# Patient Record
Sex: Female | Born: 1955
Health system: Southern US, Community
[De-identification: ages and names within clinical notes are randomized; demographics above are authoritative.]

## PROBLEM LIST (undated history)

## (undated) DIAGNOSIS — F329 Major depressive disorder, single episode, unspecified: Secondary | ICD-10-CM

## (undated) DIAGNOSIS — G2581 Restless legs syndrome: Secondary | ICD-10-CM

## (undated) DIAGNOSIS — R519 Headache, unspecified: Secondary | ICD-10-CM

## (undated) DIAGNOSIS — F1011 Alcohol abuse, in remission: Secondary | ICD-10-CM

## (undated) DIAGNOSIS — Z9289 Personal history of other medical treatment: Secondary | ICD-10-CM

## (undated) DIAGNOSIS — M1990 Unspecified osteoarthritis, unspecified site: Secondary | ICD-10-CM

## (undated) DIAGNOSIS — Z9189 Other specified personal risk factors, not elsewhere classified: Secondary | ICD-10-CM

## (undated) DIAGNOSIS — K922 Gastrointestinal hemorrhage, unspecified: Secondary | ICD-10-CM

## (undated) DIAGNOSIS — G709 Myoneural disorder, unspecified: Secondary | ICD-10-CM

## (undated) DIAGNOSIS — Z52008 Unspecified donor, other blood: Secondary | ICD-10-CM

## (undated) DIAGNOSIS — R51 Headache: Secondary | ICD-10-CM

## (undated) DIAGNOSIS — F32A Depression, unspecified: Secondary | ICD-10-CM

## (undated) DIAGNOSIS — K219 Gastro-esophageal reflux disease without esophagitis: Secondary | ICD-10-CM

## (undated) HISTORY — PX: OTHER SURGICAL HISTORY: SHX169

## (undated) HISTORY — PX: TONSILLECTOMY: SUR1361

## (undated) HISTORY — PX: BREAST SURGERY: SHX581

---

## 2000-01-15 ENCOUNTER — Other Ambulatory Visit: Admission: RE | Admit: 2000-01-15 | Discharge: 2000-01-15 | Payer: Self-pay | Admitting: *Deleted

## 2001-06-27 ENCOUNTER — Other Ambulatory Visit: Admission: RE | Admit: 2001-06-27 | Discharge: 2001-06-27 | Payer: Self-pay | Admitting: *Deleted

## 2015-11-08 DIAGNOSIS — N6011 Diffuse cystic mastopathy of right breast: Secondary | ICD-10-CM | POA: Insufficient documentation

## 2015-11-14 NOTE — Progress Notes (Signed)
Pt is being scheduled for preop appt; please place surgical orders in epic. Thanks.  

## 2015-11-15 ENCOUNTER — Other Ambulatory Visit: Payer: Self-pay | Admitting: Orthopaedic Surgery

## 2015-11-18 ENCOUNTER — Encounter (HOSPITAL_COMMUNITY)
Admission: RE | Admit: 2015-11-18 | Discharge: 2015-11-18 | Disposition: A | Discharge status: Home / Self Care | Payer: Managed Care, Other (non HMO) | Source: Ambulatory Visit | Attending: Orthopaedic Surgery | Admitting: Orthopaedic Surgery

## 2015-11-18 ENCOUNTER — Encounter (HOSPITAL_COMMUNITY): Payer: Self-pay | Admitting: *Deleted

## 2015-11-18 DIAGNOSIS — Z01812 Encounter for preprocedural laboratory examination: Secondary | ICD-10-CM | POA: Diagnosis present

## 2015-11-18 DIAGNOSIS — M1611 Unilateral primary osteoarthritis, right hip: Secondary | ICD-10-CM | POA: Insufficient documentation

## 2015-11-18 HISTORY — DX: Gastro-esophageal reflux disease without esophagitis: K21.9

## 2015-11-18 HISTORY — DX: Unspecified osteoarthritis, unspecified site: M19.90

## 2015-11-18 HISTORY — DX: Major depressive disorder, single episode, unspecified: F32.9

## 2015-11-18 HISTORY — DX: Restless legs syndrome: G25.81

## 2015-11-18 HISTORY — DX: Depression, unspecified: F32.A

## 2015-11-18 HISTORY — DX: Headache: R51

## 2015-11-18 HISTORY — DX: Headache, unspecified: R51.9

## 2015-11-18 HISTORY — DX: Gastrointestinal hemorrhage, unspecified: K92.2

## 2015-11-18 HISTORY — DX: Myoneural disorder, unspecified: G70.9

## 2015-11-18 HISTORY — DX: Alcohol abuse, in remission: F10.11

## 2015-11-18 HISTORY — DX: Unspecified donor, other blood: Z52.008

## 2015-11-18 HISTORY — DX: Other specified personal risk factors, not elsewhere classified: Z91.89

## 2015-11-18 HISTORY — DX: Personal history of other medical treatment: Z92.89

## 2015-11-18 LAB — BASIC METABOLIC PANEL
ANION GAP: 6 (ref 5–15)
BUN: 18 mg/dL (ref 6–20)
CHLORIDE: 109 mmol/L (ref 101–111)
CO2: 27 mmol/L (ref 22–32)
Calcium: 9.5 mg/dL (ref 8.9–10.3)
Creatinine, Ser: 0.81 mg/dL (ref 0.44–1.00)
Glucose, Bld: 91 mg/dL (ref 65–99)
POTASSIUM: 4.4 mmol/L (ref 3.5–5.1)
SODIUM: 142 mmol/L (ref 135–145)

## 2015-11-18 LAB — SURGICAL PCR SCREEN
MRSA, PCR: NEGATIVE
STAPHYLOCOCCUS AUREUS: POSITIVE — AB

## 2015-11-18 LAB — CBC
HEMATOCRIT: 32.9 % — AB (ref 36.0–46.0)
HEMOGLOBIN: 10.3 g/dL — AB (ref 12.0–15.0)
MCH: 27.3 pg (ref 26.0–34.0)
MCHC: 31.3 g/dL (ref 30.0–36.0)
MCV: 87.3 fL (ref 78.0–100.0)
Platelets: 447 10*3/uL — ABNORMAL HIGH (ref 150–400)
RBC: 3.77 MIL/uL — AB (ref 3.87–5.11)
RDW: 14 % (ref 11.5–15.5)
WBC: 7.9 10*3/uL (ref 4.0–10.5)

## 2015-11-18 LAB — ABO/RH: ABO/RH(D): A POS

## 2015-11-18 NOTE — Pre-Procedure Instructions (Signed)
11-18-15 1330 Pt states is a regular blood donor to [facility]. Donated 2 weeks ago-advised not to donate anymore until after surgery and recovered.

## 2015-11-18 NOTE — Patient Instructions (Addendum)
Daisy MaineJanet N Carson  11/18/2015   Your procedure is scheduled on: 11-25-15  [facility]  [facility] 3rd floor to  Short Stay Center at 0900  AM.  Call this number if you have problems the morning of surgery [phone]   Remember: ONLY 1 PERSON MAY GO WITH YOU TO SHORT STAY TO GET  READY MORNING OF YOUR SURGERY.  Do not eat food or drink liquids :After Midnight.     Take these medicines the morning of surgery with A SIP OF WATER: Pantoprazole. Pain med-as needed. Duloxetine. Gabapentin. Ranitidine. Atorvastatin. DO NOT TAKE ANY DIABETIC MEDICATIONS DAY OF YOUR SURGERY                               You may not have any metal on your body including hair pins and              piercings  Do not wear jewelry, make-up, lotions, powders or perfumes, deodorant             Do not wear nail polish.  Do not shave  48 hours prior to surgery.              Men may shave face and neck.   Do not bring valuables to the hospital. [facility] IS NOT             RESPONSIBLE   FOR VALUABLES.  Contacts, dentures or bridgework may not be worn into surgery.  Leave suitcase in the car. After surgery it may be brought to your room.     Patients discharged the day of surgery will not be allowed to drive home.  Name and phone number of your driver:[name]"Boyd"- spouse  [phone] cell  Special Instructions: N/A              Please read over the following fact sheets you were given: _____________________________________________________________________             [facility] - Preparing for [facility] Before surgery, you can play an important role.  Because skin is not sterile, your skin needs to be as free of germs as possible.  You can reduce the number of germs on your skin by washing with CHG (chlorahexidine gluconate) soap before surgery.  CHG is an antiseptic cleaner which kills germs and bonds with the skin to continue killing germs even after  washing. Please DO NOT use if you have an allergy to CHG or antibacterial soaps.  If your skin becomes reddened/irritated stop using the CHG and inform your nurse when you arrive at Short Stay. Do not shave (including legs and underarms) for at least 48 hours prior to the first CHG shower.  You may shave your face/neck. Please follow these instructions carefully:  1.  Shower with CHG Soap the night before surgery and the  morning of Surgery.  2.  If you choose to wash your hair, wash your hair first as usual with your  normal  shampoo.  3.  After you shampoo, rinse your hair and body thoroughly to remove the  shampoo.                           4.  Use CHG as you would any other liquid soap.  You  can apply chg directly  to the skin and wash                       Gently with a scrungie or clean washcloth.  5.  Apply the CHG Soap to your body ONLY FROM THE NECK DOWN.   Do not use on face/ open                           Wound or open sores. Avoid contact with eyes, ears mouth and genitals (private parts).                       Wash face,  Genitals (private parts) with your normal soap.             6.  Wash thoroughly, paying special attention to the area where your surgery  will be performed.  7.  Thoroughly rinse your body with warm water from the neck down.  8.  DO NOT shower/wash with your normal soap after using and rinsing off  the CHG Soap.                9.  Pat yourself dry with a clean towel.            10.  Wear clean pajamas.            11.  Place clean sheets on your bed the night of your first shower and do not  sleep with pets. Day of Surgery : Do not apply any lotions/deodorants the morning of surgery.  Please wear clean clothes to the hospital/surgery center.  FAILURE TO FOLLOW THESE INSTRUCTIONS MAY RESULT IN THE CANCELLATION OF YOUR SURGERY PATIENT SIGNATURE_________________________________  NURSE  SIGNATURE__________________________________  ________________________________________________________________________

## 2015-11-21 NOTE — Pre-Procedure Instructions (Signed)
11-21-15 0905 Positive for Staph aureus, pt made aware -will be treated with Betadine Ointment on arrival to Short Stay. Note to Dr. Alben Spittle. Blackman's office to inform of this.

## 2015-11-25 ENCOUNTER — Inpatient Hospital Stay (HOSPITAL_COMMUNITY): Payer: Managed Care, Other (non HMO) | Admitting: Certified Registered Nurse Anesthetist

## 2015-11-25 ENCOUNTER — Inpatient Hospital Stay (HOSPITAL_COMMUNITY): Payer: Managed Care, Other (non HMO)

## 2015-11-25 ENCOUNTER — Encounter (HOSPITAL_COMMUNITY)
Admission: RE | Disposition: A | Discharge status: Home Health | Payer: Self-pay | Source: Ambulatory Visit | Attending: Orthopaedic Surgery

## 2015-11-25 ENCOUNTER — Encounter (HOSPITAL_COMMUNITY): Payer: Self-pay | Admitting: *Deleted

## 2015-11-25 ENCOUNTER — Inpatient Hospital Stay (HOSPITAL_COMMUNITY)
Admission: RE | Admit: 2015-11-25 | Discharge: 2015-11-28 | DRG: 470 | Disposition: A | Discharge status: Home Health | Payer: Managed Care, Other (non HMO) | Source: Ambulatory Visit | Attending: Orthopaedic Surgery | Admitting: Orthopaedic Surgery

## 2015-11-25 DIAGNOSIS — G2581 Restless legs syndrome: Secondary | ICD-10-CM | POA: Diagnosis present

## 2015-11-25 DIAGNOSIS — K219 Gastro-esophageal reflux disease without esophagitis: Secondary | ICD-10-CM | POA: Diagnosis present

## 2015-11-25 DIAGNOSIS — M25551 Pain in right hip: Secondary | ICD-10-CM

## 2015-11-25 DIAGNOSIS — F329 Major depressive disorder, single episode, unspecified: Secondary | ICD-10-CM | POA: Diagnosis present

## 2015-11-25 DIAGNOSIS — Z96641 Presence of right artificial hip joint: Secondary | ICD-10-CM

## 2015-11-25 DIAGNOSIS — R52 Pain, unspecified: Secondary | ICD-10-CM

## 2015-11-25 DIAGNOSIS — M1611 Unilateral primary osteoarthritis, right hip: Principal | ICD-10-CM | POA: Diagnosis present

## 2015-11-25 DIAGNOSIS — D62 Acute posthemorrhagic anemia: Secondary | ICD-10-CM | POA: Diagnosis not present

## 2015-11-25 DIAGNOSIS — F17210 Nicotine dependence, cigarettes, uncomplicated: Secondary | ICD-10-CM | POA: Diagnosis present

## 2015-11-25 HISTORY — PX: TOTAL HIP ARTHROPLASTY: SHX124

## 2015-11-25 LAB — TYPE AND SCREEN
ABO/RH(D): A POS
ANTIBODY SCREEN: NEGATIVE

## 2015-11-25 SURGERY — ARTHROPLASTY, HIP, TOTAL, ANTERIOR APPROACH
Anesthesia: General | Site: Hip | Laterality: Right

## 2015-11-25 MED ORDER — MENTHOL 3 MG MT LOZG: 1.0000 | LOZENGE | OROMUCOSAL | Status: DC | PRN

## 2015-11-25 MED ORDER — ONDANSETRON HCL 4 MG/2ML IJ SOLN: 4.0000 mg | Freq: Four times a day (QID) | INTRAMUSCULAR | Status: DC | PRN

## 2015-11-25 MED ORDER — DEXAMETHASONE SODIUM PHOSPHATE 10 MG/ML IJ SOLN
INTRAMUSCULAR | Status: DC | PRN
  Administered 2015-11-25: 10 mg via INTRAVENOUS

## 2015-11-25 MED ORDER — ONDANSETRON HCL 4 MG/2ML IJ SOLN
INTRAMUSCULAR | Status: DC | PRN
  Administered 2015-11-25: 4 mg via INTRAVENOUS

## 2015-11-25 MED ORDER — OXYCODONE HCL 5 MG PO TABS
5.0000 mg | ORAL_TABLET | ORAL | Status: DC | PRN
  Administered 2015-11-25 – 2015-11-28 (×15): 10 mg via ORAL
  Filled 2015-11-25 (×15): qty 2

## 2015-11-25 MED ORDER — MIDAZOLAM HCL 5 MG/5ML IJ SOLN
INTRAMUSCULAR | Status: DC | PRN
  Administered 2015-11-25: 2 mg via INTRAVENOUS

## 2015-11-25 MED ORDER — PHENOL 1.4 % MT LIQD
1.0000 | OROMUCOSAL | Status: DC | PRN
  Filled 2015-11-25: qty 177

## 2015-11-25 MED ORDER — PHENYLEPHRINE 40 MCG/ML (10ML) SYRINGE FOR IV PUSH (FOR BLOOD PRESSURE SUPPORT)
PREFILLED_SYRINGE | INTRAVENOUS | Status: DC | PRN
  Administered 2015-11-25 (×3): 40 ug via INTRAVENOUS

## 2015-11-25 MED ORDER — HYDROMORPHONE HCL 1 MG/ML IJ SOLN
1.0000 mg | INTRAMUSCULAR | Status: DC | PRN
  Administered 2015-11-25 – 2015-11-27 (×4): 1 mg via INTRAVENOUS
  Filled 2015-11-25 (×4): qty 1

## 2015-11-25 MED ORDER — FENTANYL CITRATE (PF) 100 MCG/2ML IJ SOLN
INTRAMUSCULAR | Status: DC | PRN
  Administered 2015-11-25: 100 ug via INTRAVENOUS
  Administered 2015-11-25 (×3): 50 ug via INTRAVENOUS

## 2015-11-25 MED ORDER — ACETAMINOPHEN 650 MG RE SUPP: 650.0000 mg | Freq: Four times a day (QID) | RECTAL | Status: DC | PRN

## 2015-11-25 MED ORDER — MIDAZOLAM HCL 2 MG/2ML IJ SOLN
INTRAMUSCULAR | Status: AC
  Filled 2015-11-25: qty 2

## 2015-11-25 MED ORDER — ROCURONIUM BROMIDE 10 MG/ML (PF) SYRINGE
PREFILLED_SYRINGE | INTRAVENOUS | Status: DC | PRN
  Administered 2015-11-25: 40 mg via INTRAVENOUS

## 2015-11-25 MED ORDER — PANTOPRAZOLE SODIUM 40 MG PO TBEC
40.0000 mg | DELAYED_RELEASE_TABLET | Freq: Every day | ORAL | Status: DC
Start: 2015-11-26 — End: 2015-11-28
  Administered 2015-11-26 – 2015-11-28 (×3): 40 mg via ORAL
  Filled 2015-11-25 (×3): qty 1

## 2015-11-25 MED ORDER — FAMOTIDINE 20 MG PO TABS
40.0000 mg | ORAL_TABLET | Freq: Every day | ORAL | Status: DC
Start: 2015-11-26 — End: 2015-11-28
  Administered 2015-11-26 – 2015-11-28 (×3): 40 mg via ORAL
  Filled 2015-11-25 (×3): qty 2

## 2015-11-25 MED ORDER — SUGAMMADEX SODIUM 200 MG/2ML IV SOLN
INTRAVENOUS | Status: DC | PRN
  Administered 2015-11-25: 150 mg via INTRAVENOUS

## 2015-11-25 MED ORDER — ONDANSETRON HCL 4 MG/2ML IJ SOLN
4.0000 mg | Freq: Once | INTRAMUSCULAR | Status: DC | PRN
Start: 2015-11-25 — End: 2015-11-25

## 2015-11-25 MED ORDER — MEPERIDINE HCL 50 MG/ML IJ SOLN: 6.2500 mg | INTRAMUSCULAR | Status: DC | PRN

## 2015-11-25 MED ORDER — ZOLPIDEM TARTRATE 5 MG PO TABS: 5.0000 mg | ORAL_TABLET | Freq: Every evening | ORAL | Status: DC | PRN

## 2015-11-25 MED ORDER — CEFAZOLIN SODIUM-DEXTROSE 2-4 GM/100ML-% IV SOLN
INTRAVENOUS | Status: AC
  Filled 2015-11-25: qty 100

## 2015-11-25 MED ORDER — LIDOCAINE 2% (20 MG/ML) 5 ML SYRINGE
INTRAMUSCULAR | Status: AC
  Filled 2015-11-25: qty 5

## 2015-11-25 MED ORDER — ATORVASTATIN CALCIUM 20 MG PO TABS
20.0000 mg | ORAL_TABLET | Freq: Every day | ORAL | Status: DC
Start: 2015-11-26 — End: 2015-11-28
  Administered 2015-11-26 – 2015-11-28 (×3): 20 mg via ORAL
  Filled 2015-11-25 (×3): qty 1

## 2015-11-25 MED ORDER — LACTATED RINGERS IV SOLN
INTRAVENOUS | Status: DC
  Administered 2015-11-25 (×3): via INTRAVENOUS

## 2015-11-25 MED ORDER — SODIUM CHLORIDE 0.9 % IV SOLN
INTRAVENOUS | Status: DC
  Administered 2015-11-25: 21:00:00 via INTRAVENOUS

## 2015-11-25 MED ORDER — PHENYLEPHRINE 40 MCG/ML (10ML) SYRINGE FOR IV PUSH (FOR BLOOD PRESSURE SUPPORT)
PREFILLED_SYRINGE | INTRAVENOUS | Status: AC
  Filled 2015-11-25: qty 10

## 2015-11-25 MED ORDER — SUGAMMADEX SODIUM 200 MG/2ML IV SOLN
INTRAVENOUS | Status: AC
  Filled 2015-11-25: qty 2

## 2015-11-25 MED ORDER — PROPOFOL 10 MG/ML IV BOLUS
INTRAVENOUS | Status: DC | PRN
  Administered 2015-11-25: 150 mg via INTRAVENOUS

## 2015-11-25 MED ORDER — DIPHENHYDRAMINE HCL 12.5 MG/5ML PO ELIX: 12.5000 mg | ORAL_SOLUTION | ORAL | Status: DC | PRN

## 2015-11-25 MED ORDER — HYDROMORPHONE HCL 2 MG/ML IJ SOLN
INTRAMUSCULAR | Status: AC
  Filled 2015-11-25: qty 1

## 2015-11-25 MED ORDER — DOCUSATE SODIUM 100 MG PO CAPS
100.0000 mg | ORAL_CAPSULE | Freq: Two times a day (BID) | ORAL | Status: DC
  Administered 2015-11-25 – 2015-11-28 (×6): 100 mg via ORAL
  Filled 2015-11-25 (×6): qty 1

## 2015-11-25 MED ORDER — ROCURONIUM BROMIDE 10 MG/ML (PF) SYRINGE: PREFILLED_SYRINGE | INTRAVENOUS | Status: DC | PRN

## 2015-11-25 MED ORDER — METOCLOPRAMIDE HCL 5 MG/ML IJ SOLN: 5.0000 mg | Freq: Three times a day (TID) | INTRAMUSCULAR | Status: DC | PRN

## 2015-11-25 MED ORDER — ACETAMINOPHEN 325 MG PO TABS
650.0000 mg | ORAL_TABLET | Freq: Four times a day (QID) | ORAL | Status: DC | PRN
  Administered 2015-11-27 – 2015-11-28 (×2): 650 mg via ORAL
  Filled 2015-11-25 (×2): qty 2

## 2015-11-25 MED ORDER — GABAPENTIN 300 MG PO CAPS
600.0000 mg | ORAL_CAPSULE | Freq: Three times a day (TID) | ORAL | Status: DC
Start: 2015-11-25 — End: 2015-11-28
  Administered 2015-11-25 – 2015-11-28 (×9): 600 mg via ORAL
  Filled 2015-11-25 (×10): qty 2

## 2015-11-25 MED ORDER — SUCCINYLCHOLINE CHLORIDE 200 MG/10ML IV SOSY
PREFILLED_SYRINGE | INTRAVENOUS | Status: DC | PRN
  Administered 2015-11-25: 100 mg via INTRAVENOUS

## 2015-11-25 MED ORDER — METOCLOPRAMIDE HCL 5 MG PO TABS: 5.0000 mg | ORAL_TABLET | Freq: Three times a day (TID) | ORAL | Status: DC | PRN

## 2015-11-25 MED ORDER — ROCURONIUM BROMIDE 10 MG/ML (PF) SYRINGE
PREFILLED_SYRINGE | INTRAVENOUS | Status: AC
  Filled 2015-11-25: qty 10

## 2015-11-25 MED ORDER — METHOCARBAMOL 1000 MG/10ML IJ SOLN
500.0000 mg | Freq: Four times a day (QID) | INTRAVENOUS | Status: DC | PRN
  Administered 2015-11-25: 500 mg via INTRAVENOUS
  Filled 2015-11-25: qty 5
  Filled 2015-11-25: qty 550

## 2015-11-25 MED ORDER — SODIUM CHLORIDE 0.9 % IR SOLN
Status: DC | PRN
  Administered 2015-11-25: 1000 mL

## 2015-11-25 MED ORDER — PROPOFOL 10 MG/ML IV BOLUS
INTRAVENOUS | Status: AC
  Filled 2015-11-25: qty 20

## 2015-11-25 MED ORDER — ASPIRIN 81 MG PO CHEW
81.0000 mg | CHEWABLE_TABLET | Freq: Two times a day (BID) | ORAL | Status: DC
  Administered 2015-11-25 – 2015-11-28 (×6): 81 mg via ORAL
  Filled 2015-11-25 (×6): qty 1

## 2015-11-25 MED ORDER — OXYCODONE HCL 5 MG PO TABS: 5.0000 mg | ORAL_TABLET | Freq: Once | ORAL | Status: DC | PRN

## 2015-11-25 MED ORDER — METHOCARBAMOL 500 MG PO TABS
500.0000 mg | ORAL_TABLET | Freq: Four times a day (QID) | ORAL | Status: DC | PRN
  Administered 2015-11-26 – 2015-11-28 (×6): 500 mg via ORAL
  Filled 2015-11-25 (×7): qty 1

## 2015-11-25 MED ORDER — ONDANSETRON HCL 4 MG PO TABS: 4.0000 mg | ORAL_TABLET | Freq: Four times a day (QID) | ORAL | Status: DC | PRN

## 2015-11-25 MED ORDER — LIDOCAINE 2% (20 MG/ML) 5 ML SYRINGE
INTRAMUSCULAR | Status: DC | PRN
  Administered 2015-11-25: 100 mg via INTRAVENOUS

## 2015-11-25 MED ORDER — FENTANYL CITRATE (PF) 250 MCG/5ML IJ SOLN
INTRAMUSCULAR | Status: AC
  Filled 2015-11-25: qty 5

## 2015-11-25 MED ORDER — HYDROMORPHONE HCL 1 MG/ML IJ SOLN
INTRAMUSCULAR | Status: DC | PRN
  Administered 2015-11-25 (×2): 0.5 mg via INTRAVENOUS
  Administered 2015-11-25: 1 mg via INTRAVENOUS

## 2015-11-25 MED ORDER — TRANEXAMIC ACID 1000 MG/10ML IV SOLN
1000.0000 mg | INTRAVENOUS | Status: AC
  Administered 2015-11-25: 1000 mg via INTRAVENOUS
  Filled 2015-11-25: qty 1100

## 2015-11-25 MED ORDER — CEFAZOLIN SODIUM-DEXTROSE 2-4 GM/100ML-% IV SOLN
2.0000 g | INTRAVENOUS | Status: AC
  Administered 2015-11-25: 2 g via INTRAVENOUS

## 2015-11-25 MED ORDER — HYDROMORPHONE HCL 1 MG/ML IJ SOLN
0.2500 mg | INTRAMUSCULAR | Status: DC | PRN
  Administered 2015-11-25: 0.5 mg via INTRAVENOUS

## 2015-11-25 MED ORDER — HYDROMORPHONE HCL 1 MG/ML IJ SOLN
INTRAMUSCULAR | Status: AC
  Administered 2015-11-25: 0.5 mg via INTRAVENOUS
  Filled 2015-11-25: qty 1

## 2015-11-25 MED ORDER — ONDANSETRON HCL 4 MG/2ML IJ SOLN
INTRAMUSCULAR | Status: AC
  Filled 2015-11-25: qty 2

## 2015-11-25 MED ORDER — DEXAMETHASONE SODIUM PHOSPHATE 10 MG/ML IJ SOLN
INTRAMUSCULAR | Status: AC
  Filled 2015-11-25: qty 1

## 2015-11-25 MED ORDER — ALUM & MAG HYDROXIDE-SIMETH 200-200-20 MG/5ML PO SUSP: 30.0000 mL | ORAL | Status: DC | PRN

## 2015-11-25 MED ORDER — OXYCODONE HCL 5 MG/5ML PO SOLN
5.0000 mg | Freq: Once | ORAL | Status: DC | PRN
  Filled 2015-11-25: qty 5

## 2015-11-25 MED ORDER — DULOXETINE HCL 60 MG PO CPEP
60.0000 mg | ORAL_CAPSULE | Freq: Every day | ORAL | Status: DC
Start: 2015-11-26 — End: 2015-11-28
  Administered 2015-11-26 – 2015-11-28 (×3): 60 mg via ORAL
  Filled 2015-11-25 (×3): qty 1

## 2015-11-25 SURGICAL SUPPLY — 39 items
APL SKNCLS STERI-STRIP NONHPOA (GAUZE/BANDAGES/DRESSINGS) ×1
BAG SPEC THK2 15X12 ZIP CLS (MISCELLANEOUS)
BAG ZIPLOCK 12X15 (MISCELLANEOUS) IMPLANT
BENZOIN TINCTURE PRP APPL 2/3 (GAUZE/BANDAGES/DRESSINGS) ×3 IMPLANT
BLADE SAW SGTL 18X1.27X75 (BLADE) ×2 IMPLANT
BLADE SAW SGTL 18X1.27X75MM (BLADE) ×1
CAPT HIP TOTAL 2 ×2 IMPLANT
CELLS DAT CNTRL 66122 CELL SVR (MISCELLANEOUS) ×1 IMPLANT
CLOSURE WOUND 1/2 X4 (GAUZE/BANDAGES/DRESSINGS) ×2
CLOTH BEACON ORANGE TIMEOUT ST (SAFETY) ×3 IMPLANT
DRAPE STERI IOBAN 125X83 (DRAPES) ×3 IMPLANT
DRAPE U-SHAPE 47X51 STRL (DRAPES) ×6 IMPLANT
DRSG AQUACEL AG ADV 3.5X10 (GAUZE/BANDAGES/DRESSINGS) ×3 IMPLANT
DURAPREP 26ML APPLICATOR (WOUND CARE) ×3 IMPLANT
ELECT REM PT RETURN 9FT ADLT (ELECTROSURGICAL) ×3
ELECTRODE REM PT RTRN 9FT ADLT (ELECTROSURGICAL) ×1 IMPLANT
GAUZE XEROFORM 1X8 LF (GAUZE/BANDAGES/DRESSINGS) IMPLANT
GLOVE BIO SURGEON STRL SZ7.5 (GLOVE) ×3 IMPLANT
GLOVE BIOGEL PI IND STRL 8 (GLOVE) ×2 IMPLANT
GLOVE BIOGEL PI INDICATOR 8 (GLOVE) ×4
GLOVE ECLIPSE 8.0 STRL XLNG CF (GLOVE) ×3 IMPLANT
GOWN STRL REUS W/TWL XL LVL3 (GOWN DISPOSABLE) ×6 IMPLANT
HANDPIECE INTERPULSE COAX TIP (DISPOSABLE) ×3
HOLDER FOLEY CATH W/STRAP (MISCELLANEOUS) ×3 IMPLANT
PACK ANTERIOR HIP CUSTOM (KITS) ×3 IMPLANT
RETRACTOR WND ALEXIS 18 MED (MISCELLANEOUS) ×1 IMPLANT
RTRCTR WOUND ALEXIS 18CM MED (MISCELLANEOUS) ×3
SET HNDPC FAN SPRY TIP SCT (DISPOSABLE) ×1 IMPLANT
STAPLER VISISTAT 35W (STAPLE) IMPLANT
STRIP CLOSURE SKIN 1/2X4 (GAUZE/BANDAGES/DRESSINGS) ×2 IMPLANT
SUT ETHIBOND NAB CT1 #1 30IN (SUTURE) ×3 IMPLANT
SUT MNCRL AB 4-0 PS2 18 (SUTURE) IMPLANT
SUT VIC AB 0 CT1 36 (SUTURE) ×3 IMPLANT
SUT VIC AB 1 CT1 36 (SUTURE) ×3 IMPLANT
SUT VIC AB 2-0 CT1 27 (SUTURE) ×6
SUT VIC AB 2-0 CT1 TAPERPNT 27 (SUTURE) ×2 IMPLANT
TRAY FOLEY CATH 14FRSI W/METER (CATHETERS) ×3 IMPLANT
TRAY FOLEY W/METER SILVER 16FR (SET/KITS/TRAYS/PACK) IMPLANT
YANKAUER SUCT BULB TIP NO VENT (SUCTIONS) ×3 IMPLANT

## 2015-11-25 NOTE — Anesthesia Preprocedure Evaluation (Signed)
Anesthesia Evaluation  Patient identified by MRN, date of birth, ID band Patient awake    Reviewed: Allergy & Precautions, NPO status , Patient's Chart, lab work & pertinent test results  Airway Mallampati: I  TM Distance: >3 FB Neck ROM: Full    Dental  (+) Teeth Intact, Dental Advisory Given   Pulmonary Current Smoker,    breath sounds clear to auscultation       Cardiovascular  Rhythm:Regular Rate:Normal     Neuro/Psych    GI/Hepatic GERD  Medicated and Controlled,  Endo/Other    Renal/GU      Musculoskeletal   Abdominal   Peds  Hematology   Anesthesia Other Findings   Reproductive/Obstetrics                             Anesthesia Physical Anesthesia Plan  ASA: I  Anesthesia Plan: General   Post-op Pain Management:    Induction: Intravenous  Airway Management Planned: Oral ETT  Additional Equipment:   Intra-op Plan:   Post-operative Plan: Extubation in OR  Informed Consent: I have reviewed the patients History and Physical, chart, labs and discussed the procedure including the risks, benefits and alternatives for the proposed anesthesia with the patient or authorized representative who has indicated his/her understanding and acceptance.   Dental advisory given  Plan Discussed with: CRNA, Anesthesiologist and Surgeon  Anesthesia Plan Comments:         Anesthesia Quick Evaluation  

## 2015-11-25 NOTE — Progress Notes (Signed)
Patient refused to have catheter removed this morning at 0930. Patient stated she wanted to work with physical therapy first.   Patient then refused for catheter to be removed this afternoon around 1500. Patient stated "im just not there yet, i'm just not there"  PA called this afternoon at 1700 stating that the catheter needed to be removed.   Catheter removed per PA order.

## 2015-11-25 NOTE — Anesthesia Postprocedure Evaluation (Signed)
Anesthesia Post Note  Patient: Daisy Carson  Procedure(s) Performed: Procedure(s) (LRB): RIGHT TOTAL HIP ARTHROPLASTY ANTERIOR APPROACH (Right)  Patient location during evaluation: PACU Anesthesia Type: General Level of consciousness: awake and alert Pain management: pain level controlled Vital Signs Assessment: post-procedure vital signs reviewed and stable Respiratory status: spontaneous breathing, nonlabored ventilation, respiratory function stable and patient connected to face mask oxygen Cardiovascular status: blood pressure returned to baseline and stable Postop Assessment: no signs of nausea or vomiting Anesthetic complications: no    Last Vitals:  Vitals:   11/25/15 1430 11/25/15 1445  BP: 139/73 (!) 146/79  Pulse: (!) 105 97  Resp: 15 10  Temp:      Last Pain:  Vitals:   11/25/15 1445  TempSrc:   PainSc: Asleep                 Mialee Weyman A

## 2015-11-25 NOTE — Brief Op Note (Signed)
11/25/2015  1:34 PM  PATIENT:  Daisy Carson  60 y.o. female  PRE-OPERATIVE DIAGNOSIS:  osteoarthritis right hip  POST-OPERATIVE DIAGNOSIS:  osteoarthritis right hip  PROCEDURE:  Procedure(s): RIGHT TOTAL HIP ARTHROPLASTY ANTERIOR APPROACH (Right)  SURGEON:  Surgeon(s) and Role:    * Kathryne Hitchhristopher Y Sita Mangen, MD - Primary  PHYSICIAN ASSISTANT: Rexene EdisonGil Clark, PA-C  ANESTHESIA:   general  EBL:  Total I/O In: 1000 [I.V.:1000] Out: 250 [Urine:100; Blood:150]  COUNTS:  YES  DICTATION: .Other Dictation: Dictation Number [id number]  PLAN OF CARE: Admit to inpatient   PATIENT DISPOSITION:  PACU - hemodynamically stable.   Delay start of Pharmacological VTE agent (>24hrs) due to surgical blood loss or risk of bleeding: no

## 2015-11-25 NOTE — Transfer of Care (Signed)
Immediate Anesthesia Transfer of Care Note  Patient: Daisy Carson  Procedure(s) Performed: Procedure(s): RIGHT TOTAL HIP ARTHROPLASTY ANTERIOR APPROACH (Right)  Patient Location: PACU  Anesthesia Type:General  Level of Consciousness:  sedated, patient cooperative and responds to stimulation  Airway & Oxygen Therapy:Patient Spontanous Breathing and Patient connected to face mask oxgen  Post-op Assessment:  Report given to PACU RN and Post -op Vital signs reviewed and stable  Post vital signs:  Reviewed and stable  Last Vitals:  Vitals:   11/25/15 0929 11/25/15 1356  BP: 131/61   Pulse: 75   Resp: 18 (P) 14  Temp: 36.4 C     Complications: No apparent anesthesia complications

## 2015-11-25 NOTE — Progress Notes (Signed)
Pt has noted reddish bruise on right upper arm.. No drainage noted.

## 2015-11-25 NOTE — H&P (Signed)
TOTAL HIP ADMISSION H&P  Patient is admitted for right total hip arthroplasty.  Subjective:  Chief Complaint: right hip pain  HPI: Daisy Carson, 60 y.o. female, has a history of pain and functional disability in the right hip(s) due to arthritis and patient has failed non-surgical conservative treatments for greater than 12 weeks to include NSAID's and/or analgesics, flexibility and strengthening excercises, use of assistive devices, weight reduction as appropriate and activity modification.  Onset of symptoms was gradual starting 2 years ago with gradually worsening course since that time.The patient noted no past surgery on the right hip(s).  Patient currently rates pain in the right hip at 10 out of 10 with activity. Patient has night pain, worsening of pain with activity and weight bearing, pain that interfers with activities of daily living and pain with passive range of motion. Patient has evidence of subchondral sclerosis, periarticular osteophytes and joint space narrowing by imaging studies. This condition presents safety issues increasing the risk of falls.  There is no current active infection.  Patient Active Problem List   Diagnosis Date Noted  . Osteoarthritis of right hip 11/25/2015   Past Medical History:  Diagnosis Date  . Arthritis    osteoarthritis right hip/ hands  . At low risk for impaired skin integrity    rash lower right leg around ankle and lower leg-Uses topical cream as needed, goes and comes for over a year"  . Blood donor    Pt states"a regular blood donor" -every 3 months  . Depression   . GERD (gastroesophageal reflux disease)    pantoprazole controls.  . GI bleeding    "ulcer"- required 4 units Blood- 4'14 [facility]  . H/O: alcohol abuse    x 5 yrs none- then has had a few drinks in past few weeks to help with stress"  . Headache    migraines"early years"-no longer a problem  . Neuromuscular disorder (HCC)   . Restless leg syndrome     Gabapentin helps  . Transfusion history     Past Surgical History:  Procedure Laterality Date  . BREAST SURGERY     breast biopsy- benign  . Childbirth     x1 NVD  . TONSILLECTOMY     child    No prescriptions prior to admission.   No Known Allergies  Social History  Substance Use Topics  . Smoking status: Light Tobacco Smoker    Types: Cigarettes  . Smokeless tobacco: Never Used     Comment: smokes 4 cigs per day-"helps with anxiety"  . Alcohol use No     Comment: Hx. Alcohol abuse- 25yrs ago"has had a few drinks wine since leading up to surgery"    No family history on file.   Review of Systems  Musculoskeletal: Positive for joint pain.  All other systems reviewed and are negative.   Objective:  Physical Exam  Constitutional: She is oriented to person, place, and time. She appears well-developed and well-nourished.  HENT:  Head: Normocephalic and atraumatic.  Eyes: EOM are normal. Pupils are equal, round, and reactive to light.  Neck: Normal range of motion. Neck supple.  Cardiovascular: Normal rate and regular rhythm.   Respiratory: Effort normal and breath sounds normal.  GI: Soft. Bowel sounds are normal.  Musculoskeletal:       Right hip: She exhibits decreased range of motion, decreased strength, tenderness and bony tenderness.  Neurological: She is alert and oriented to person, place, and time.  Skin: Skin is warm  and dry.  Psychiatric: She has a normal mood and affect.    Vital signs in last 24 hours:    Labs:   Estimated body mass index is 27.38 kg/m as calculated from the following:   Height as of 11/18/15: 5\' 4"  (1.626 m).   Weight as of 11/18/15: 72.3 kg (159 lb 8 oz).   Imaging Review Plain radiographs demonstrate severe degenerative joint disease of the right hip(s). The bone quality appears to be good for age and reported activity level.  Assessment/Plan:  End stage arthritis, right hip(s)  The patient history, physical examination,  clinical judgement of the provider and imaging studies are consistent with end stage degenerative joint disease of the right hip(s) and total hip arthroplasty is deemed medically necessary. The treatment options including medical management, injection therapy, arthroscopy and arthroplasty were discussed at length. The risks and benefits of total hip arthroplasty were presented and reviewed. The risks due to aseptic loosening, infection, stiffness, dislocation/subluxation,  thromboembolic complications and other imponderables were discussed.  The patient acknowledged the explanation, agreed to proceed with the plan and consent was signed. Patient is being admitted for inpatient treatment for surgery, pain control, PT, OT, prophylactic antibiotics, VTE prophylaxis, progressive ambulation and ADL's and discharge planning.The patient is planning to be discharged home with home health services

## 2015-11-25 NOTE — Anesthesia Procedure Notes (Signed)
Procedure Name: Intubation Date/Time: 11/25/2015 12:10 PM Performed by: Epimenio SarinJARVELA, Xana Bradt R Pre-anesthesia Checklist: Patient identified, Emergency Drugs available, Suction available, Patient being monitored and Timeout performed Patient Re-evaluated:Patient Re-evaluated prior to inductionOxygen Delivery Method: Circle system utilized Preoxygenation: Pre-oxygenation with 100% oxygen Intubation Type: IV induction Ventilation: Mask ventilation without difficulty Laryngoscope Size: Mac and 3 Grade View: Grade II Tube type: Oral Tube size: 7.0 mm Number of attempts: 1 Airway Equipment and Method: Stylet Placement Confirmation: ETT inserted through vocal cords under direct vision,  positive ETCO2 and breath sounds checked- equal and bilateral Secured at: 21 cm Tube secured with: Tape Dental Injury: Teeth and Oropharynx as per pre-operative assessment

## 2015-11-26 LAB — CBC
HEMATOCRIT: 27.8 % — AB (ref 36.0–46.0)
HEMOGLOBIN: 9 g/dL — AB (ref 12.0–15.0)
MCH: 27.9 pg (ref 26.0–34.0)
MCHC: 32.4 g/dL (ref 30.0–36.0)
MCV: 86.1 fL (ref 78.0–100.0)
Platelets: 380 10*3/uL (ref 150–400)
RBC: 3.23 MIL/uL — AB (ref 3.87–5.11)
RDW: 13.5 % (ref 11.5–15.5)
WBC: 13 10*3/uL — ABNORMAL HIGH (ref 4.0–10.5)

## 2015-11-26 LAB — BASIC METABOLIC PANEL
Anion gap: 4 — ABNORMAL LOW (ref 5–15)
BUN: 11 mg/dL (ref 6–20)
CO2: 26 mmol/L (ref 22–32)
CREATININE: 0.62 mg/dL (ref 0.44–1.00)
Calcium: 8.7 mg/dL — ABNORMAL LOW (ref 8.9–10.3)
Chloride: 111 mmol/L (ref 101–111)
Glucose, Bld: 135 mg/dL — ABNORMAL HIGH (ref 65–99)
POTASSIUM: 4.1 mmol/L (ref 3.5–5.1)
SODIUM: 141 mmol/L (ref 135–145)

## 2015-11-26 NOTE — Progress Notes (Signed)
CSW consulted for SNF placement. PN reviewed. PT recommends HHPT at d/c. RNCM will assist with d/c planning needs.  Jadee Golebiewski LCSW [phone] 

## 2015-11-26 NOTE — Op Note (Signed)
Daisy Carson, Daisy Carson                 ACCOUNT NO.:  [id number]  MEDICAL RECORD NO.:  [id number]  LOCATION:  1604                         FACILITY:  [facility]  PHYSICIAN:  Vanita Panda. Magnus Ivan, M.D.DATE OF BIRTH:  [birth year]  DATE OF PROCEDURE:  11/25/2015 DATE OF DISCHARGE:                              OPERATIVE REPORT   PREOPERATIVE DIAGNOSES:  Severe osteoarthritis and degenerative joint disease, right hip.  POSTOPERATIVE DIAGNOSES:  Severe osteoarthritis and degenerative joint disease, right hip.  PROCEDURE:  Right total hip arthroplasty through direct anterior approach.  IMPLANTS:  DePuy Sector Gription acetabular component size 50, size 32+ 0 polyethylene liner, size 12 Corail femoral component with varus offset (KLA), size 32+ 1 ceramic hip ball.  SURGEON:  Vanita Panda. Magnus Ivan, M.D.  ASSISTANT:  Richardean Canal, PA-C.  ANESTHESIA:  General.  ANTIBIOTICS:  2 g of IV Ancef.  BLOOD LOSS:  150 mL.  COMPLICATIONS:  None.  INDICATIONS:  Ms. Imler is a very pleasant 60 year old female, well known to me.  She has surprisingly significant arthritis involving her right hip.  X-rays showed complete loss of the joint space with sclerotic and cystic changes in the femoral head and the acetabulum, and there was severe joint space narrowing.  Her pain is daily and it has detrimentally affected her mobility, her activities of daily living, and her quality of life.  She has tried and failed all forms of conservative treatment and at this point, does wish to proceed with a total hip arthroplasty..  I talked to her about direct anterior hip surgery and discussed about the risk of acute blood loss anemia, nerve and vessel injury, fracture, infection, dislocation, DVT.  She understands our goals are decreased pain, improved mobility, and overall improved quality of life.  PROCEDURE DESCRIPTION:  After informed consent was obtained, appropriate right hip was marked.  She was brought  to the operating room, where general anesthesia was obtained while she was on a stretcher.  A Foley catheter was placed.  Then, both feet had traction boots applied to them.  Next, she was placed supine on the Hana fracture table with a perineal post in place and both legs in inline skeletal traction devices, but no traction applied.  Her right operative hip was then prepped and draped with DuraPrep and sterile drapes.  Time-out was called.  She was identified as correct patient and correct right hip.  I then made an incision just inferior and posterior to the anterior superior iliac spine and carried this obliquely down the leg.  We dissected down the tensor fascia lata muscle.  The tensor fascia was then divided longitudinally to proceed with a direct anterior approach to the hip.  We identified and cauterized the circumflex vessels and then identified the hip capsule.  I opened up the hip capsule finding a significant joint effusion and significant periarticular osteophytes.  I placed the Cobra retractors within the joint capsule and made our femoral neck cut with an oscillating saw proximal to the lesser trochanter and completed this with an osteotome.  We placed a corkscrew guide in the femoral head and removed the femoral head in its entirety and found to be devoid of  cartilage.  I then cleaned the acetabulum, remnants of acetabular labrum and other debris including loose bodies that I found.  I then began reaming under direct visualization using a size 43 reamer up to a size 50 with all reamers under direct visualization.  The last reamer was placed under direct fluoroscopy, so I could obtain my depth of reaming, my inclination, and anteversion. Once I was pleased with this, I placed the real DePuy Sector Gription acetabular component size 50 and a 32+ 0 neutral polyethylene liner for that size acetabular component.  Attention was then turned to the femur. With the leg externally  rotated to 100 degrees, extended and adducted, I was able to place a Mueller retractor medially and a Hohmann retractor behind the greater trochanter.  I released the lateral joint capsule and used a box cutting osteotome to enter femoral canal and a rongeur to lateralize.  I then began broaching using a size 8 broach using the Corail broaching system up to a size 12.  With a size 12 in place and based on her anatomy, we trialed a varus offset femoral neck and a 32+ 1 hip ball.  We rolled the leg back over and up with traction and rotation, reducing the pelvis.  We were pleased with leg length, offset, stability, range of motion, and its examination under fluoroscopy as well.  We then dislocated the hip and removed the trial components.  We were able to place the real Corail femoral component with varus offset size 12 and the real 32+ 1 ceramic hip ball.  I reduced this in the acetabulum and again, we were pleased with stability.  We then irrigated the soft tissue with normal saline solution using pulsatile lavage.  We closed the joint capsule with interrupted #1 Ethibond suture followed by #1 running Vicryl in the tensor fascia, 0 Vicryl in the deep tissue, 2-0 Vicryl in the subcutaneous tissue, and 4-0 Monocryl subcuticular stitch. Steri-Strips and Aquacel dressings were applied.  She was then taken off the Hana table, awakened, extubated and taken to the recovery room in stable condition.  All final counts were correct.  There were no complications noted.  Of note, Richardean CanalGilbert Clark, PA-C assisted in the entire case.  His assistance was crucial for facilitating all aspects of this case.     Vanita Pandahristopher Y. Magnus IvanBlackman, M.D.     CYB/MEDQ  D:  11/25/2015  T:  11/26/2015  Job:  [id number]

## 2015-11-26 NOTE — Progress Notes (Signed)
Physical Therapy Treatment Patient Details Name: Gwinda MaineJanet N Carpenito MRN: [id number] DOB: [birth year] Today's Date: 11/26/2015    History of Present Illness [name]    PT Comments    Patient is progressing well. May DC tomorrow.  Follow Up Recommendations  Home health PT;Supervision/Assistance - 24 hour     Equipment Recommendations  None recommended by PT    Recommendations for Other Services       Precautions / Restrictions Precautions Precautions: Fall    Mobility  Bed Mobility   Bed Mobility: Sit to Supine       Sit to supine: Modified independent (Device/Increase time)   General bed mobility comments: hooks left foot under the right, self assists   Transfers   Equipment used: Standard walker Transfers: Sit to/from Stand Sit to Stand: Supervision            Ambulation/Gait   Ambulation Distance (Feet): 200 Feet Assistive device: Standard walker Gait Pattern/deviations: Step-to pattern     General Gait Details: simulated use of SW as that is what she has   Information systems managertairs            Wheelchair Mobility    Modified Rankin (Stroke Patients Only)       Balance                                    Cognition Arousal/Alertness: Awake/alert                          Exercises Total Joint Exercises Ankle Circles/Pumps: AROM;Both;10 reps;Supine Short Arc Quad: AROM;Right;10 reps;Supine Heel Slides: AAROM;Right;10 reps;Supine Hip ABduction/ADduction: AAROM;Right;10 reps;Supine    General Comments        Pertinent Vitals/Pain Pain Score: 3  Pain Location: R thigh Pain Descriptors / Indicators: Sore Pain Intervention(s): Premedicated before session;Ice applied    Home Living                      Prior Function            PT Goals (current goals can now be found in the care plan section) Progress towards PT goals: Progressing toward goals    Frequency    7X/week      PT Plan Current plan remains  appropriate    Co-evaluation             End of Session   Activity Tolerance: Patient tolerated treatment well Patient left: in bed;with call bell/phone within reach;with family/visitor present     Time: [id number] PT Time Calculation (min) (ACUTE ONLY): 30 min  Charges:  $Gait Training: 23-37 mins                    G Codes:      Rada HayHill, Elva Breaker Elizabeth 11/26/2015, 3:24 PM

## 2015-11-26 NOTE — Evaluation (Signed)
Occupational Therapy Evaluation and Discharge Patient Details Name: Daisy Carson MRN: [id number] DOB: [birth year] Today's Date: 11/26/2015    History of Present Illness [name]   Clinical Impression   Pt was active and independent prior to admission. Present with post op pain and impaired standing balance, but overall moving well. Pt performed toileting and grooming with supervision and shower transfer with min guard assist. Educated pt in use of AE for LB ADL and multiple uses of 3 in 1. Pt demonstrated understanding of all information. No further OT needs.   Follow Up Recommendations  No OT follow up    Equipment Recommendations       Recommendations for Other Services       Precautions / Restrictions Precautions Precautions: Fall Restrictions Weight Bearing Restrictions: No      Mobility Bed Mobility Pt OOB upon OT's arrival  Transfers Overall transfer level: Needs assistance Equipment used: Standard walker Transfers: Sit to/from Stand Sit to Stand: Min guard         General transfer comment: cues for hand and right leg position    Balance                                            ADL Overall ADL's : Needs assistance/impaired Eating/Feeding: Independent;Sitting   Grooming: Wash/dry hands;Oral care;Supervision/safety;Standing   Upper Body Bathing: Set up;Sitting   Lower Body Bathing: Minimal assistance;Sit to/from stand Lower Body Bathing Details (indicate cue type and reason): educated in use of long handled sponge Upper Body Dressing : Set up;Sitting   Lower Body Dressing: Minimal assistance;Sit to/from stand Lower Body Dressing Details (indicate cue type and reason): educated in use of AE, husband to purchase in gift shop Toilet Transfer: Financial risk analyst Details (indicate cue type and reason): BSC over toile Toileting- Architect and Hygiene: Supervision/safety;Sit to/from stand   Tub/ Shower Transfer: Min guard;Ambulation;3 in 1;Walk-in shower Tub/Shower Transfer Details (indicate cue type and reason): educated in technique Functional mobility during ADLs: Supervision/safety;Rolling walker General ADL Comments: educated in multiple uses of 3in 1     Vision     Perception     Praxis      Pertinent Vitals/Pain Pain Assessment: 0-10 Pain Score: 5  Pain Location: R thigh Pain Descriptors / Indicators: Sore;Burning Pain Intervention(s): Monitored during session;Premedicated before session;Repositioned;Ice applied     Hand Dominance Right   Extremity/Trunk Assessment Upper Extremity Assessment Upper Extremity Assessment: Overall WFL for tasks assessed   Lower Extremity Assessment Lower Extremity Assessment: Defer to PT evaluation RLE Deficits / Details: advances the leg, hip flexion to 90 *       Communication Communication Communication: No difficulties   Cognition Arousal/Alertness: Awake/alert Behavior During Therapy: WFL for tasks assessed/performed Overall Cognitive Status: Within Functional Limits for tasks assessed                     General Comments       Exercises       Shoulder Instructions      Home Living Family/patient expects to be discharged to:: Private residence Living Arrangements: Spouse/significant other Available Help at Discharge: Family Type of Home: House Home Access: Level entry   Entrance Stairs-Rails: None Home Layout: Multi-level Alternate Level Stairs-Number of Steps: 3 to 2 different levels Alternate Level Stairs-Rails: None Bathroom Shower/Tub: Producer, television/film/video: Standard  Home Equipment: Walker - standard;Bedside commode          Prior Functioning/Environment Level of Independence: Independent                 OT Problem List:     OT Treatment/Interventions:      OT Goals(Current goals can be found in the care plan section) Acute Rehab OT Goals Patient  Stated Goal: to walk without pain  OT Frequency:     Barriers to D/C:            Co-evaluation              End of Session Equipment Utilized During Treatment: Gait belt;Rolling walker Nurse Communication: Mobility status  Activity Tolerance: Patient tolerated treatment well Patient left: in chair;with call bell/phone within reach;with chair alarm set   Time: 1610-96040926-1007 OT Time Calculation (min): 41 min Charges:  OT General Charges $OT Visit: 1 Procedure OT Evaluation $OT Eval Low Complexity: 1 Procedure OT Treatments $Self Care/Home Management : 8-22 mins G-Codes:    [name], [name] 11/26/2015, 11:32 AM  [phone]

## 2015-11-26 NOTE — Progress Notes (Signed)
Subjective: 1 Day Post-Op Procedure(s) (LRB): RIGHT TOTAL HIP ARTHROPLASTY ANTERIOR APPROACH (Right) Patient reports pain as moderate.    Objective: Vital signs in last 24 hours: Temp:  [97.8 F (36.6 C)-99.7 F (37.6 C)] 99.7 F (37.6 C) (09/30 1014) Pulse Rate:  [93-118] 94 (09/30 1014) Resp:  [10-16] 15 (09/30 1014) BP: (105-154)/(61-90) 128/66 (09/30 1014) SpO2:  [93 %-100 %] 94 % (09/30 1014)  Intake/Output from previous day: 09/29 0701 - 09/30 0700 In: 2710 [P.O.:360; I.V.:2295; IV Piggyback:55] Out: 2200 [Urine:2050; Blood:150] Intake/Output this shift: Total I/O In: 240 [P.O.:240] Out: 400 [Urine:400]   Recent Labs  11/26/15 0408  HGB 9.0*    Recent Labs  11/26/15 0408  WBC 13.0*  RBC 3.23*  HCT 27.8*  PLT 380    Recent Labs  11/26/15 0408  NA 141  K 4.1  CL 111  CO2 26  BUN 11  CREATININE 0.62  GLUCOSE 135*  CALCIUM 8.7*   No results for input(s): LABPT, INR in the last 72 hours.  Sensation intact distally Intact pulses distally Dorsiflexion/Plantar flexion intact Incision: dressing C/D/I  Assessment/Plan: 1 Day Post-Op Procedure(s) (LRB): RIGHT TOTAL HIP ARTHROPLASTY ANTERIOR APPROACH (Right) Up with therapy Plan for discharge tomorrow Discharge home with home health  Kathryne HitchChristopher Y David Rodriquez 11/26/2015, 12:53 PM

## 2015-11-26 NOTE — Evaluation (Signed)
Physical Therapy Evaluation Patient Details Name: Daisy Carson MRN: [id number] DOB: Jan 02, 1956 Today's Date: 11/26/2015   History of Present Illness  [name]  Clinical Impression  The patient tolerated very well. She did well using the standard walker and see no need to get a RW as she will most likely not need walker for very long. Pt admitted with above diagnosis. Pt currently with functional limitations due to the deficits listed below (see PT Problem List).  Pt will benefit from skilled PT to increase their independence and safety with mobility to allow discharge to the venue listed below.       Follow Up Recommendations Home health PT;Supervision/Assistance - 24 hour    Equipment Recommendations  None recommended by PT    Recommendations for Other Services       Precautions / Restrictions Precautions Precautions: Fall Restrictions Weight Bearing Restrictions: No      Mobility  Bed Mobility Overal bed mobility: Needs Assistance Bed Mobility: Supine to Sit     Supine to sit: Supervision     General bed mobility comments: hooks ledft foot under the right, self assists to sit up  Transfers Overall transfer level: Needs assistance Equipment used: Standard walker Transfers: Sit to/from Stand Sit to Stand: Min guard         General transfer comment: cues for hand and right leg position  Ambulation/Gait Ambulation/Gait assistance: Min guard   Assistive device: Standard walker Gait Pattern/deviations: Step-to pattern;Step-through pattern     General Gait Details: simulated use of SW as that is what she has  Information systems manager Rankin (Stroke Patients Only)       Balance                                             Pertinent Vitals/Pain Pain Assessment: 0-10 Pain Score: 5  Pain Location: R thigh Pain Descriptors / Indicators: Sore Pain Intervention(s): Monitored during session;Premedicated  before session    Home Living Family/patient expects to be discharged to:: Private residence Living Arrangements: Spouse/significant other Available Help at Discharge: Family Type of Home: House Home Access: Level entry Entrance Stairs-Rails: None   Home Layout: Multi-level Home Equipment: Walker - standard;Bedside commode      Prior Function Level of Independence: Independent               Hand Dominance        Extremity/Trunk Assessment   Upper Extremity Assessment: Defer to OT evaluation           Lower Extremity Assessment: RLE deficits/detail RLE Deficits / Details: advances the leg, hip flexion to 90 *       Communication      Cognition Arousal/Alertness: Awake/alert Behavior During Therapy: WFL for tasks assessed/performed Overall Cognitive Status: Within Functional Limits for tasks assessed                      General Comments      Exercises Total Joint Exercises Ankle Circles/Pumps: AROM;Both;10 reps;Supine Short Arc Quad: AROM;Right;10 reps;Supine Heel Slides: AAROM;Right;10 reps;Supine Hip ABduction/ADduction: AAROM;Right;10 reps;Supine   Assessment/Plan    PT Assessment Patient needs continued PT services  PT Problem List Decreased strength;Decreased range of motion;Decreased activity tolerance;Decreased mobility;Decreased knowledge of use of DME;Decreased knowledge of precautions  PT Treatment Interventions DME instruction;Gait training;Stair training;Functional mobility training;Therapeutic activities;Patient/family education    PT Goals (Current goals can be found in the Care Plan section)  Acute Rehab PT Goals Patient Stated Goal: to walk without pain PT Goal Formulation: With patient Time For Goal Achievement: 11/29/15 Potential to Achieve Goals: Good    Frequency 7X/week   Barriers to discharge        Co-evaluation               End of Session   Activity Tolerance: Patient tolerated treatment  well Patient left:  (up with OT) Nurse Communication: Mobility status         Time: [id number] PT Time Calculation (min) (ACUTE ONLY): 36 min   Charges:   PT Evaluation $PT Eval Low Complexity: 1 Procedure PT Treatments $Gait Training: 8-22 mins   PT G Codes:        Sharen HeckHill, Jonalyn Sedlak Elizabeth Mionna Advincula PT [phone]  11/26/2015, 9:50 AM

## 2015-11-26 NOTE — Progress Notes (Signed)
Pt stated she took her own Tylenol, once at 04:00 am. Explained to pt not to take her personal meds and pt verbalized understanding. Meds to go home with spouse this am. Day shift nurse made aware and will continue to monitor patient.

## 2015-11-27 LAB — CBC
HEMATOCRIT: 24.5 % — AB (ref 36.0–46.0)
Hemoglobin: 7.9 g/dL — ABNORMAL LOW (ref 12.0–15.0)
MCH: 27.4 pg (ref 26.0–34.0)
MCHC: 32.2 g/dL (ref 30.0–36.0)
MCV: 85.1 fL (ref 78.0–100.0)
Platelets: 313 10*3/uL (ref 150–400)
RBC: 2.88 MIL/uL — AB (ref 3.87–5.11)
RDW: 13.8 % (ref 11.5–15.5)
WBC: 8.7 10*3/uL (ref 4.0–10.5)

## 2015-11-27 MED ORDER — ASPIRIN 81 MG PO CHEW: 81.0000 mg | CHEWABLE_TABLET | Freq: Two times a day (BID) | ORAL | 0 refills | Status: DC

## 2015-11-27 MED ORDER — METHOCARBAMOL 500 MG PO TABS: 500.0000 mg | ORAL_TABLET | Freq: Four times a day (QID) | ORAL | 0 refills | Status: DC | PRN

## 2015-11-27 MED ORDER — OXYCODONE-ACETAMINOPHEN 5-325 MG PO TABS: 1.0000 | ORAL_TABLET | ORAL | 0 refills | Status: DC | PRN

## 2015-11-27 NOTE — Care Management Note (Signed)
Case Management Note  Patient Details  Name: Daisy Carson MRN: [id number] Date of Birth: October 13, 1955  Subjective/Objective:     Right Total Hip Arthroplasty               Action/Plan: Discharge Planning: NCM spoke to pt at bedside. Pt states she has RW and 3n1 bedside commode at home. [facility] and [facility] will [facility]. Faxed referral to [facility] # [phone].  And [facility] #[phone]. Will fax dc summary when available.   PCP - Marylen PontoHOLT, LYNLEY S MD    Expected Discharge Date:  11/28/2015             Expected Discharge Plan:  Home w Home Health Services  In-House Referral:  NA  Discharge planning Services  CM Consult  Post Acute Care Choice:  Home Health Choice offered to:  Patient  DME Arranged:  N/A DME Agency:  NA  HH Arranged:  PT HH Agency:  Other - See comment  Status of Service:  Completed, signed off  If discussed at Long Length of Stay Meetings, dates discussed:    Additional Comments:  Elliot CousinShavis, Daemion Mcniel Ellen, RN 11/27/2015, 11:18 AM

## 2015-11-27 NOTE — Progress Notes (Signed)
Physical Therapy Treatment Patient Details Name: Daisy Carson MRN: [id number] DOB: 08-15-1955 Today's Date: 11/27/2015    History of Present Illness [name]    PT Comments    POD # 2 am session Assisted OOB to amb to bathroom then in hallway.  Performed some THR TE's followed by ICE.   Follow Up Recommendations  Home health PT;Supervision/Assistance - 24 hour     Equipment Recommendations  None recommended by PT    Recommendations for Other Services       Precautions / Restrictions Restrictions Weight Bearing Restrictions: No Other Position/Activity Restrictions: WBAT    Mobility  Bed Mobility Overal bed mobility: Needs Assistance Bed Mobility: Supine to Sit     Supine to sit: Supervision     General bed mobility comments: hooks left foot under the right, self assists   Transfers Overall transfer level: Needs assistance Equipment used: Rolling walker (2 wheeled) Transfers: Sit to/from Stand Sit to Stand: Supervision         General transfer comment: cues for hand and right leg position  Ambulation/Gait Ambulation/Gait assistance: Supervision;Min guard Ambulation Distance (Feet): 55 Feet Assistive device: Rolling walker (2 wheeled) Gait Pattern/deviations: Step-to pattern;Decreased stance time - right     General Gait Details: simulated use of SW as that is what she has   Information systems managertairs            Wheelchair Mobility    Modified Rankin (Stroke Patients Only)       Balance                                    Cognition Arousal/Alertness: Awake/alert Behavior During Therapy: WFL for tasks assessed/performed Overall Cognitive Status: Within Functional Limits for tasks assessed                      Exercises   Total Hip Replacement TE's 10 reps ankle pumps 10 reps knee presses 10 reps heel slides 10 reps SAQ's 10 reps ABD Followed by ICE     General Comments        Pertinent Vitals/Pain Pain Assessment:  0-10 Pain Score: 5  Pain Location: R thigh/hip Pain Descriptors / Indicators: Constant;Tightness Pain Intervention(s): Monitored during session;Repositioned;Ice applied    Home Living                      Prior Function            PT Goals (current goals can now be found in the care plan section) Progress towards PT goals: Progressing toward goals    Frequency    7X/week      PT Plan Current plan remains appropriate    Co-evaluation             End of Session Equipment Utilized During Treatment: Gait belt Activity Tolerance: Patient tolerated treatment well Patient left: in chair;with call bell/phone within reach     Time: 0927-0954 PT Time Calculation (min) (ACUTE ONLY): 27 min  Charges:  $Gait Training: 8-22 mins $Therapeutic Exercise: 8-22 mins                    G Codes:      Felecia ShellingLori Deshannon Seide  PTA [facility]  Acute  Rehab Pager      [phone]

## 2015-11-27 NOTE — Progress Notes (Signed)
Subjective: 2 Days Post-Op Procedure(s) (LRB): RIGHT TOTAL HIP ARTHROPLASTY ANTERIOR APPROACH (Right) Patient reports pain as moderate.  H/H low, but tolerating well her acute blood loss anemia from surgery.  Objective: Vital signs in last 24 hours: Temp:  [98.7 F (37.1 C)-99.6 F (37.6 C)] 99.6 F (37.6 C) (10/01 0643) Pulse Rate:  [88-113] 113 (10/01 0643) Resp:  [16-18] 18 (10/01 0643) BP: (101-124)/(59-69) 124/69 (10/01 0643) SpO2:  [93 %-97 %] 93 % (10/01 0643)  Intake/Output from previous day: 09/30 0701 - 10/01 0700 In: 960 [P.O.:960] Out: 850 [Urine:850] Intake/Output this shift: Total I/O In: 240 [P.O.:240] Out: -    Recent Labs  11/26/15 0408 11/27/15 0441  HGB 9.0* 7.9*    Recent Labs  11/26/15 0408 11/27/15 0441  WBC 13.0* 8.7  RBC 3.23* 2.88*  HCT 27.8* 24.5*  PLT 380 313    Recent Labs  11/26/15 0408  NA 141  K 4.1  CL 111  CO2 26  BUN 11  CREATININE 0.62  GLUCOSE 135*  CALCIUM 8.7*   No results for input(s): LABPT, INR in the last 72 hours.  Sensation intact distally Intact pulses distally Dorsiflexion/Plantar flexion intact Incision: scant drainage  Assessment/Plan: 2 Days Post-Op Procedure(s) (LRB): RIGHT TOTAL HIP ARTHROPLASTY ANTERIOR APPROACH (Right) Up with therapy Plan for discharge tomorrow Discharge home with home health  Kathryne HitchChristopher Y Jasimine Simms 11/27/2015, 11:42 AM

## 2015-11-27 NOTE — Discharge Instructions (Signed)

## 2015-11-27 NOTE — Progress Notes (Signed)
Physical Therapy Treatment Patient Details Name: Daisy Carson MRN: [id number] DOB: [birth year] Today's Date: 11/27/2015    History of Present Illness Daisy Carson    PT Comments    [name] Assisted with amb a greater distance then back to bed per pt request to rest.   Follow Up Recommendations  Home health PT;Supervision/Assistance - 24 hour     Equipment Recommendations  None recommended by PT    Recommendations for Other Services       Precautions / Restrictions Restrictions Weight Bearing Restrictions: No Other Position/Activity Restrictions: WBAT    Mobility  Bed Mobility Overal bed mobility: Needs Assistance Bed Mobility: Supine to Sit     Supine to sit: Supervision     General bed mobility comments: assisted back to ned  Transfers Overall transfer level: Needs assistance Equipment used: Rolling walker (2 wheeled) Transfers: Sit to/from Stand Sit to Stand: Supervision         General transfer comment: cues for hand and right leg position  Ambulation/Gait Ambulation/Gait assistance: Supervision;Min guard Ambulation Distance (Feet): 70 Feet Assistive device: Rolling walker (2 wheeled) Gait Pattern/deviations: Step-to pattern;Decreased stance time - right     General Gait Details: simulated use of SW as that is what she has, tolerated increased distance.   Stairs            Wheelchair Mobility    Modified Rankin (Stroke Patients Only)       Balance                                    Cognition Arousal/Alertness: Awake/alert Behavior During Therapy: WFL for tasks assessed/performed Overall Cognitive Status: Within Functional Limits for tasks assessed                      Exercises      General Comments        Pertinent Vitals/Pain Pain Assessment: 0-10 Pain Score: 5  Pain Location: Daisy thigh/hip Pain Descriptors / Indicators: Constant;Tightness Pain Intervention(s): Monitored during  session;Repositioned;Ice applied    Home Living                      Prior Function            PT Goals (current goals can now be found in the care plan section) Progress towards PT goals: Progressing toward goals    Frequency    7X/week      PT Plan Current plan remains appropriate    Co-evaluation             End of Session Equipment Utilized During Treatment: Gait belt Activity Tolerance: Patient tolerated treatment well Patient left: in chair;with call bell/phone within reach     Time: [id number] PT Time Calculation (min) (ACUTE ONLY): 16 min  Charges:  $Gait Training: 8-22 mins $Therapeutic Exercise: 8-22 mins                    G Codes:     Felecia ShellingLori [employer]  PTA [facility]  Acute  Rehab Pager      [phone]

## 2015-11-28 LAB — CBC
HEMATOCRIT: 24.6 % — AB (ref 36.0–46.0)
HEMOGLOBIN: 7.8 g/dL — AB (ref 12.0–15.0)
MCH: 27.9 pg (ref 26.0–34.0)
MCHC: 31.7 g/dL (ref 30.0–36.0)
MCV: 87.9 fL (ref 78.0–100.0)
Platelets: 293 10*3/uL (ref 150–400)
RBC: 2.8 MIL/uL — AB (ref 3.87–5.11)
RDW: 14.2 % (ref 11.5–15.5)
WBC: 7.5 10*3/uL (ref 4.0–10.5)

## 2015-11-28 NOTE — Progress Notes (Signed)
RN reviewed discharge paperwork with patient and family. All questions answered.  Paperwork and prescriptions given  Called Case Management Bjorn Loser(Rhonda) about PT home health. Waiting on insurance verification and Bjorn LoserRhonda will notify patient once cleared.   NT rolled patient down to family car.

## 2015-11-28 NOTE — Progress Notes (Signed)
Date: November 28, 2015 Discharge orders checked for needs. [phone] Marcelle Smilinghonda Caldonia Leap, RN, BSN, [place]   [phone]

## 2015-11-28 NOTE — Progress Notes (Signed)
Date: November 28, 2015 [facility] [phone]/spoke with Ivette LoyalBrenda F  Intake number is [id number]/ wcb with provider. Marcelle Smilinghonda Davis, RN, BSN, [place]   [phone]

## 2015-11-28 NOTE — Progress Notes (Signed)
Patient ID: Daisy MaineJanet N Carson, female   DOB: 02-Aug-1955, 60 y.o.   MRN: [id number] Doing well.  Vitals stable. H/H stable.  Asymptomatic.  Can be discharged to home today.

## 2015-11-28 NOTE — Discharge Summary (Signed)
Patient ID: EDITHE DOBBIN MRN: [id number] DOB/AGE: [birth year] 60 y.o.  Admit date: 11/25/2015 Discharge date: 11/28/2015  Admission Diagnoses:  Principal Problem:   Osteoarthritis of right hip Active Problems:   Status post total replacement of right hip   Discharge Diagnoses:  Same  Past Medical History:  Diagnosis Date  . Arthritis    osteoarthritis right hip/ hands  . At low risk for impaired skin integrity    rash lower right leg around ankle and lower leg-Uses topical cream as needed, goes and comes for over a year"  . Blood donor    Pt states"a regular blood donor" -every 3 months  . Depression   . GERD (gastroesophageal reflux disease)    pantoprazole controls.  . GI bleeding    "ulcer"- required 4 units Blood- 4'14 [facility]  . H/O: alcohol abuse    x 5 yrs none- then has had a few drinks in past few weeks to help with stress"  . Headache    migraines"early years"-no longer a problem  . Neuromuscular disorder (HCC)   . Restless leg syndrome    Gabapentin helps  . Transfusion history     Surgeries: Procedure(s): RIGHT TOTAL HIP ARTHROPLASTY ANTERIOR APPROACH on 11/25/2015   Consultants:   Discharged Condition: Improved  Hospital Course: TIPHANIE VO is an 60 y.o. female who was admitted 11/25/2015 for operative treatment ofOsteoarthritis of right hip. Patient has severe unremitting pain that affects sleep, daily activities, and work/hobbies. After pre-op clearance the patient was taken to the operating room on 11/25/2015 and underwent  Procedure(s): RIGHT TOTAL HIP ARTHROPLASTY ANTERIOR APPROACH.    Patient was given perioperative antibiotics: Anti-infectives    Start     Dose/Rate Route Frequency Ordered Stop   11/25/15 0912  ceFAZolin (ANCEF) IVPB 2g/100 mL premix     2 g 200 mL/hr over 30 Minutes Intravenous On call to O.R. 11/25/15 0912 11/25/15 1212       Patient was given sequential compression devices, early ambulation, and  chemoprophylaxis to prevent DVT.  Patient benefited maximally from hospital stay and there were no complications.    Recent vital signs: Patient Vitals for the past 24 hrs:  BP Temp Temp src Pulse Resp SpO2  11/28/15 0528 (!) 113/54 98.9 F (37.2 C) Oral 86 16 94 %  11/27/15 2112 120/60 98.5 F (36.9 C) Oral 96 18 98 %  11/27/15 1414 104/68 99.1 F (37.3 C) Oral 95 17 97 %     Recent laboratory studies:  Recent Labs  11/26/15 0408 11/27/15 0441 11/28/15 0404  WBC 13.0* 8.7 7.5  HGB 9.0* 7.9* 7.8*  HCT 27.8* 24.5* 24.6*  PLT 380 313 293  NA 141  --   --   K 4.1  --   --   CL 111  --   --   CO2 26  --   --   BUN 11  --   --   CREATININE 0.62  --   --   GLUCOSE 135*  --   --   CALCIUM 8.7*  --   --      Discharge Medications:     Medication List    STOP taking these medications   acetaminophen-codeine 300-30 MG tablet Commonly known as:  TYLENOL #3     TAKE these medications   acetaminophen 650 MG CR tablet Commonly known as:  TYLENOL Take 1,300 mg by mouth daily as needed for pain.   aspirin 81 MG chewable tablet  Chew 1 tablet (81 mg total) by mouth 2 (two) times daily.   atorvastatin 20 MG tablet Commonly known as:  LIPITOR Take 20 mg by mouth daily.   CENTRUM SILVER PO Take 1 tablet by mouth daily.   DULoxetine 60 MG capsule Commonly known as:  CYMBALTA Take 60 mg by mouth daily.   gabapentin 600 MG tablet Commonly known as:  NEURONTIN Take 600 mg by mouth 3 (three) times daily.   meloxicam 15 MG tablet Commonly known as:  MOBIC Take 15 mg by mouth daily.   methocarbamol 500 MG tablet Commonly known as:  ROBAXIN Take 1 tablet (500 mg total) by mouth every 6 (six) hours as needed for muscle spasms.   OSTEO BI-FLEX TRIPLE STRENGTH PO Take 2 tablets by mouth daily.   oxyCODONE-acetaminophen 5-325 MG tablet Commonly known as:  ROXICET Take 1-2 tablets by mouth every 4 (four) hours as needed.   pantoprazole 40 MG tablet Commonly known as:   PROTONIX Take 40 mg by mouth daily.   PRESCRIPTION MEDICATION Apply 1 application topically 2 (two) times daily as needed (irritation/sores on foot). clotrimazole betamethasone dipropionate cream   ranitidine 300 MG tablet Commonly known as:  ZANTAC Take 300 mg by mouth daily.       Diagnostic Studies: Dg C-arm 1-60 Min-no Report  Result Date: 11/25/2015 CLINICAL DATA: pain C-ARM 1-60 MINUTES Fluoroscopy was utilized by the requesting physician.  No radiographic interpretation.   Dg Hip Port Unilat With Pelvis 1v Right  Result Date: 11/25/2015 CLINICAL DATA:  Post right hip replacement EXAM: DG HIP (WITH OR WITHOUT PELVIS) 1V PORT RIGHT COMPARISON:  9/29/ 17 FINDINGS: Two views of the right hip submitted. There is right hip prosthesis with anatomic alignment. Postsurgical changes with periarticular soft tissue air. IMPRESSION: Right hip prosthesis with anatomic alignment. Postsurgical changes are noted. Electronically Signed   By: Natasha MeadLiviu  Pop M.D.   On: 11/25/2015 14:25   Dg Hip Operative Unilat With Pelvis Right  Result Date: 11/25/2015 CLINICAL DATA:  Right hip arthroplasty for right hip pain EXAM: OPERATIVE right HIP (WITH PELVIS IF PERFORMED) 2 VIEWS TECHNIQUE: Fluoroscopic spot image(s) were submitted for interpretation post-operatively. COMPARISON:  None. FINDINGS: Cm fluoroscopic views were acquired of the right hip intraoperatively. Subcutaneous emphysema is seen about the right total hip arthroplasty. No hardware failure nor fracture is apparent. Acetabular component is intact. IMPRESSION: Right noncemented total hip arthroplasty with surrounding soft tissue subcutaneous emphysema. No hardware failure nor postoperative fracture. Electronically Signed   By: Tollie Ethavid  Kwon M.D.   On: 11/25/2015 13:59    Disposition: to home  Discharge Instructions    Call MD / Call 911    Complete by:  As directed    If you experience chest pain or shortness of breath, CALL 911 and be transported to  the hospital emergency room.  If you develope a fever above 101 F, pus (white drainage) or increased drainage or redness at the wound, or calf pain, call your surgeon's office.   Constipation Prevention    Complete by:  As directed    Drink plenty of fluids.  Prune juice may be helpful.  You may use a stool softener, such as Colace (over the counter) 100 mg twice a day.  Use MiraLax (over the counter) for constipation as needed.   Diet - low sodium heart healthy    Complete by:  As directed    Discharge patient    Complete by:  As directed    Increase  activity slowly as tolerated    Complete by:  As directed       Follow-up Information    Kathryne Hitch, MD Follow up in 2 week(s).   Specialty:  Orthopedic Surgery Contact information: [address] [place] [address] [phone]            Signed: Kathryne Hitch 11/28/2015, 7:38 AM

## 2015-11-28 NOTE — Progress Notes (Signed)
Physical Therapy Treatment Patient Details Name: Daisy MaineJanet N Straker MRN: [id number] DOB: [birth year] Today's Date: 11/28/2015    History of Present Illness [name]    PT Comments    The [atient is progressing well. Ready for DC today.  Follow Up Recommendations  Home health PT;Supervision/Assistance - 24 hour     Equipment Recommendations  None recommended by PT    Recommendations for Other Services       Precautions / Restrictions Precautions Precautions: Fall    Mobility  Bed Mobility Overal bed mobility: Modified Independent                Transfers Overall transfer level: Modified independent Equipment used: Standard walker;Rolling walker (2 wheeled)                Ambulation/Gait Ambulation/Gait assistance: Supervision Ambulation Distance (Feet): 50 Feet Assistive device: Standard walker Gait Pattern/deviations: Step-through pattern;Step-to pattern     General Gait Details: simulated use of SW as that is what she has, tolerated increased distance.   Stairs Stairs: Yes Stairs assistance: Min assist Stair Management: No rails;Backwards;With walker Number of Stairs: 2 General stair comments: cues for sequence. will demo to spouse when he arrives.  Wheelchair Mobility    Modified Rankin (Stroke Patients Only)       Balance                                    Cognition Arousal/Alertness: Awake/alert                          Exercises Total Joint Exercises Ankle Circles/Pumps: AROM;Supine Quad Sets: AROM;Both;10 reps Short Arc Quad: AROM;Right;10 reps;Supine Heel Slides: AAROM;Right;10 reps;Supine Hip ABduction/ADduction: AAROM;Right;10 reps;Supine    General Comments        Pertinent Vitals/Pain Pain Score: 1  Pain Location: right thigh    Home Living                      Prior Function            PT Goals (current goals can now be found in the care plan section) Progress towards PT goals:  Progressing toward goals    Frequency    7X/week      PT Plan Current plan remains appropriate    Co-evaluation             End of Session   Activity Tolerance: Patient tolerated treatment well Patient left: in bed;with call bell/phone within reach     Time: 0851-0916 PT Time Calculation (min) (ACUTE ONLY): 25 min  Charges:  $Gait Training: 8-22 mins $Therapeutic Exercise: 8-22 mins                    G Codes:      Rada HayHill, Emine Lopata Elizabeth 11/28/2015, 9:25 AM

## 2015-12-08 ENCOUNTER — Inpatient Hospital Stay (INDEPENDENT_AMBULATORY_CARE_PROVIDER_SITE_OTHER): Payer: Managed Care, Other (non HMO) | Admitting: Orthopaedic Surgery

## 2015-12-08 DIAGNOSIS — M1611 Unilateral primary osteoarthritis, right hip: Secondary | ICD-10-CM

## 2015-12-08 DIAGNOSIS — M25551 Pain in right hip: Secondary | ICD-10-CM

## 2015-12-26 ENCOUNTER — Other Ambulatory Visit (INDEPENDENT_AMBULATORY_CARE_PROVIDER_SITE_OTHER): Payer: Self-pay | Admitting: Orthopaedic Surgery

## 2015-12-27 ENCOUNTER — Telehealth (INDEPENDENT_AMBULATORY_CARE_PROVIDER_SITE_OTHER): Payer: Self-pay | Admitting: Radiology
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
## 2015-12-29 ENCOUNTER — Ambulatory Visit (INDEPENDENT_AMBULATORY_CARE_PROVIDER_SITE_OTHER): Payer: Managed Care, Other (non HMO)

## 2015-12-29 ENCOUNTER — Ambulatory Visit (INDEPENDENT_AMBULATORY_CARE_PROVIDER_SITE_OTHER): Payer: Self-pay | Admitting: Orthopaedic Surgery

## 2015-12-29 DIAGNOSIS — Z96641 Presence of right artificial hip joint: Secondary | ICD-10-CM

## 2015-12-29 DIAGNOSIS — M25551 Pain in right hip: Secondary | ICD-10-CM
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
## 2015-12-29 NOTE — Telephone Encounter (Signed)
Please advise 

## 2016-01-05 ENCOUNTER — Ambulatory Visit (INDEPENDENT_AMBULATORY_CARE_PROVIDER_SITE_OTHER): Payer: Managed Care, Other (non HMO) | Admitting: Orthopaedic Surgery

## 2016-01-25 ENCOUNTER — Ambulatory Visit (INDEPENDENT_AMBULATORY_CARE_PROVIDER_SITE_OTHER): Payer: Managed Care, Other (non HMO) | Admitting: Orthopaedic Surgery

## 2016-01-26 ENCOUNTER — Ambulatory Visit (INDEPENDENT_AMBULATORY_CARE_PROVIDER_SITE_OTHER): Payer: Self-pay | Admitting: Physician Assistant

## 2016-01-26 ENCOUNTER — Encounter (INDEPENDENT_AMBULATORY_CARE_PROVIDER_SITE_OTHER): Payer: Self-pay | Admitting: Physician Assistant

## 2016-01-26 VITALS — Ht 64.0 in | Wt 159.0 lb

## 2016-01-26 DIAGNOSIS — Z96641 Presence of right artificial hip joint: Secondary | ICD-10-CM

## 2016-01-26 NOTE — Progress Notes (Signed)
Office Visit Note   Patient: Daisy CaldronJanet Nelson Carson           Date of Birth: 05/29/1955           MRN: [id number] Visit Date: 01/26/2016              Requested by: Marylen PontoLynley S Holt, MD [address] [place] [phone], PCP: Marylen PontoHOLT,LYNLEY S, MD   Assessment & Plan: Visit Diagnoses:  1. Status post total replacement of right hip     Plan: Continue to work on strengthening of the right hip. Scar tissue mobilization.  Follow-Up Instructions: Return in about 4 weeks (around 02/23/2016).   Orders:  No orders of the defined types were placed in this encounter.  No orders of the defined types were placed in this encounter.     Procedures: No procedures performed   Clinical Data: No additional findings.   Subjective: Chief Complaint  Patient presents with  . Right Hip - Routine Post Op    Right total hip arthroplasty 11/25/15    Patient presents today for right total hip arthroplasty 11/25/2015. She is over two months post op. She is doing well. She still has some numbness. She does have decreased strength and wants to know when her full strength will return. She is also requesting a return to work note for the 18th of December and is asking about her driving privleges today.    Review of Systems  See history of present illness Objective: Vital Signs: Ht 5\' 4"  (1.626 m)   Wt 159 lb (72.1 kg)   BMI 27.29 kg/m   Physical Exam  Constitutional: She appears well-developed and well-nourished. No distress.    Ortho Exam Right hip incision healing well no signs of infection. She is able to cause snapping of her IT band with motion of the hip. A hip overall good range of motion without pain. She is able to ambulate without any assistive device. Specialty Comments:  No specialty comments available.  Imaging: No results found.   PMFS History: Patient Active Problem List   Diagnosis Date Noted  . Osteoarthritis of right hip 11/25/2015  . Status post total replacement  of right hip 11/25/2015   Past Medical History:  Diagnosis Date  . Arthritis    osteoarthritis right hip/ hands  . At low risk for impaired skin integrity    rash lower right leg around ankle and lower leg-Uses topical cream as needed, goes and comes for over a year"  . Blood donor    Pt states"a regular blood donor" -every 3 months  . Depression   . GERD (gastroesophageal reflux disease)    pantoprazole controls.  . GI bleeding    "ulcer"- [facility]  . H/O: alcohol abuse    x 5 yrs none- then has had a few drinks in past few weeks to help with stress"  . Headache    migraines"early years"-no longer a problem  . Neuromuscular disorder (HCC)   . Restless leg syndrome    Gabapentin helps  . Transfusion history     History reviewed. No pertinent family history.  Past Surgical History:  Procedure Laterality Date  . BREAST SURGERY     breast biopsy- benign  . Childbirth     x1 NVD  . TONSILLECTOMY     child  . TOTAL HIP ARTHROPLASTY Right 11/25/2015   Procedure: RIGHT TOTAL HIP ARTHROPLASTY ANTERIOR APPROACH;  Surgeon: Kathryne Hitchhristopher Y Blackman, MD;  Location: [facility];  Service: Orthopedics;  Laterality: Right;   Social History   Occupational History  . Not on file.   Social History Main Topics  . Smoking status: Light Tobacco Smoker    Types: Cigarettes  . Smokeless tobacco: Never Used     Comment: smokes 4 cigs per day-"helps with anxiety"  . Alcohol use No     Comment: Hx. Alcohol abuse- 7124yrs ago"has had a few drinks wine since leading up to surgery"  . Drug use: No  . Sexual activity: Yes    Birth control/ protection: None

## 2016-02-23 ENCOUNTER — Ambulatory Visit (INDEPENDENT_AMBULATORY_CARE_PROVIDER_SITE_OTHER): Payer: Managed Care, Other (non HMO) | Admitting: Physician Assistant

## 2016-04-24 ENCOUNTER — Telehealth (INDEPENDENT_AMBULATORY_CARE_PROVIDER_SITE_OTHER): Payer: Self-pay | Admitting: Orthopaedic Surgery

## 2016-04-24 NOTE — Telephone Encounter (Signed)
Faxed note to her dentist

## 2016-04-24 NOTE — Telephone Encounter (Signed)
Could you please fax the patients dentist office and let them know its okay for her to not take antibiotics before a dental cleaning. They just want something on file from the doctor. Fax # [phone]

## 2016-10-18 ENCOUNTER — Telehealth (INDEPENDENT_AMBULATORY_CARE_PROVIDER_SITE_OTHER): Payer: Self-pay | Admitting: Orthopaedic Surgery

## 2016-10-18 NOTE — Telephone Encounter (Signed)
Please advise 

## 2016-10-18 NOTE — Telephone Encounter (Signed)
Yes. That will be fine.  More power to her!

## 2016-10-18 NOTE — Telephone Encounter (Signed)
Patient called asking if she could go sky diving? She had hip replacement surgery last year. CB # [phone]

## 2016-10-19 NOTE — Telephone Encounter (Signed)
LMOM for patient of the below message  

## 2018-02-22 IMAGING — RF DG HIP (WITH PELVIS) OPERATIVE*R*
1 series · 2 of 2 positions shown · non-contrast
Comparison: None.

CLINICAL DATA: Right hip arthroplasty for right hip pain

EXAM:
OPERATIVE right HIP (WITH PELVIS IF PERFORMED) 2 VIEWS
TECHNIQUE: Fluoroscopic spot image(s) were submitted for interpretation
post-operatively.

[Series 1: run · 2 of 2 slices shown]
[im 1/2]
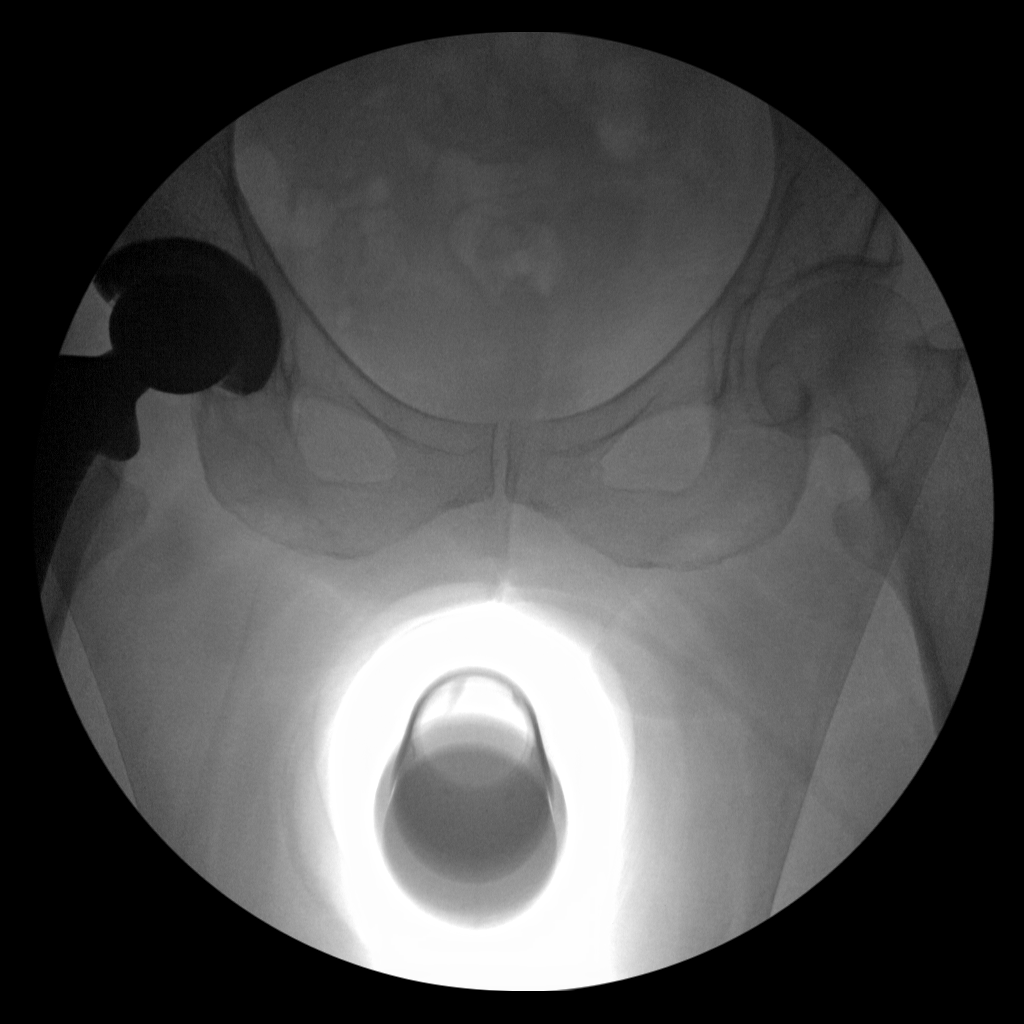
[im 2/2]
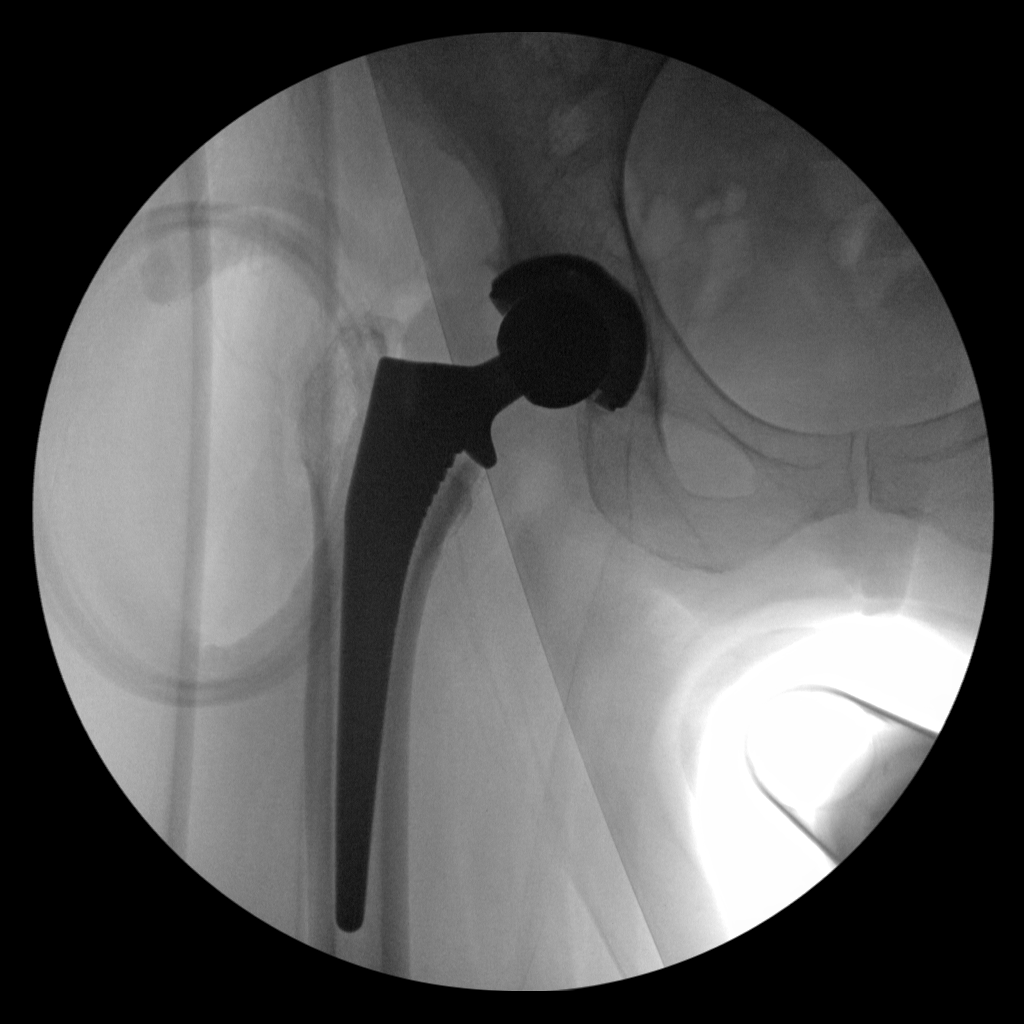

[2 of 2 positions shown; findings below may reference images not displayed]

FINDINGS: Cm fluoroscopic views were acquired of the right hip
intraoperatively. Subcutaneous emphysema is seen about the right
total hip arthroplasty. No hardware failure nor fracture is
apparent. Acetabular component is intact.
IMPRESSION: Right noncemented total hip arthroplasty with surrounding soft
tissue subcutaneous emphysema. No hardware failure nor postoperative
fracture.

## 2020-03-19 DIAGNOSIS — F1010 Alcohol abuse, uncomplicated: Secondary | ICD-10-CM | POA: Insufficient documentation

## 2020-04-26 DIAGNOSIS — G8929 Other chronic pain: Secondary | ICD-10-CM | POA: Insufficient documentation

## 2020-09-21 DIAGNOSIS — I6389 Other cerebral infarction: Secondary | ICD-10-CM

## 2020-11-03 ENCOUNTER — Other Ambulatory Visit: Payer: Self-pay

## 2020-11-03 ENCOUNTER — Encounter: Payer: Self-pay | Admitting: Neurology

## 2020-11-03 ENCOUNTER — Ambulatory Visit (INDEPENDENT_AMBULATORY_CARE_PROVIDER_SITE_OTHER): Payer: Medicare Other | Admitting: Neurology

## 2020-11-03 VITALS — BP 120/76 | HR 91 | Ht 64.0 in | Wt 145.0 lb

## 2020-11-03 DIAGNOSIS — G43009 Migraine without aura, not intractable, without status migrainosus: Secondary | ICD-10-CM

## 2020-11-03 DIAGNOSIS — G459 Transient cerebral ischemic attack, unspecified: Secondary | ICD-10-CM | POA: Diagnosis not present

## 2020-11-03 MED ORDER — TOPIRAMATE 50 MG PO TABS: 50.0000 mg | ORAL_TABLET | Freq: Every evening | ORAL | 2 refills | Status: DC

## 2020-11-03 NOTE — Patient Instructions (Signed)
Start Topiramate 1/2 tablet for one week then increase to 1 tablet  Keep well hydrated  Follow up with your other doctors  Return in 6 months or sooner if worse

## 2020-11-03 NOTE — Progress Notes (Signed)
[facility]  PATIENT: Daisy Carson DOB: [birth year]  REFERRING CLINICIAN: Marylen Ponto, MD HISTORY FROM: Patient and Husband [name] REASON FOR VISIT: Recent hospitalization for TIA/Encephalopathy.    HISTORICAL  CHIEF COMPLAINT:  Chief Complaint  Patient presents with   New Patient (Initial Visit)    New patient: paper referral req follow up from hospitalization for encephalopathy suggestive of TIA, Room 13, Husband [name] in room    HISTORY OF PRESENT ILLNESS:  This is a 65 year old woman with past medical history of alcohol abuse, osteoporosis, hyperlipidemia, headaches who is presenting after hospitalization for possible TIA and encephalopathy.  History obtained via chart review and from husband.  Her husband mentioned that on July 20th, patient was working in the garden when she came inside the house she looks flushed, she was able to go to her meeting that day and when she got back in the house she was confused with she was not making any sense. She was observed but her shoes in the dishwasher.  She was able to rest and go to sleep.  Husband noted from July 20 to the 27 she had intermittent confusion and on the 27th she was not making any sense, was acting strange and has some weakness therefore he called EMS.  Patient was taken to a local hospital and admitted.  During this admission, she had no clear source of infection, she had a MRI brain which was negative, she also had a CTA of the head and neck which did not show any occlusion.  They performed a EEG which did not show any seizure activity and no epileptiform discharge.  She also have echocardiogram which was normal she was discharged home and she was felt to have at the TIA versus heat exhaustion versus encephalopathy of unknown etiology.   Husband also has mentioned that in early August patient had food from fast food and believes that she may have had a food poisoning because since then she has been  having vomiting, cannot keep anything down until August 27.  He has been keeping a diary.  On August 28 husband noted that she has been back to his to her normal self, no more vomiting.  Patient also noted she had a history of migraine headache usually will get to 3 headache per week headaches is left on the left as the left side of her face behind the left eye described the pain as sharp pain, sometimes she will see a spot.  When she used to take Tylenol for headaches.  At time headaches can be bad associated with nausea with photophobia as an seeing spots.  She is currently not on any preventive or abortive medication.    OTHER MEDICAL CONDITIONS: Headaches, AA meeting recovering from alcohol (had a relapse in January), Depression, GERD, osteoporosis, HLD,    REVIEW OF SYSTEMS: Full 14 system review of systems performed and negative with exception of: As noted in the HPI  ALLERGIES: No Known Allergies  HOME MEDICATIONS: Outpatient Medications Prior to Visit  Medication Sig Dispense Refill   acetaminophen (TYLENOL) 650 MG CR tablet Take 1,300 mg by mouth daily as needed for pain.     atorvastatin (LIPITOR) 20 MG tablet Take 20 mg by mouth daily.     gabapentin (NEURONTIN) 600 MG tablet Take 600 mg by mouth 3 (three) times daily.     Misc Natural Products (OSTEO BI-FLEX TRIPLE STRENGTH PO) Take 2 tablets by mouth daily.     Multiple Vitamins-Minerals (  CENTRUM SILVER PO) Take 1 tablet by mouth daily.     pantoprazole (PROTONIX) 40 MG tablet Take 40 mg by mouth daily.     aspirin 81 MG chewable tablet Chew 1 tablet (81 mg total) by mouth 2 (two) times daily. (Patient not taking: Reported on 01/26/2016) 30 tablet 0   DULoxetine (CYMBALTA) 60 MG capsule Take 60 mg by mouth daily.     meloxicam (MOBIC) 15 MG tablet Take 15 mg by mouth daily.     methocarbamol (ROBAXIN) 500 MG tablet TAKE 1 TABLET EVERY 6 HOURS AS NEEDED FOR MUSCLE SPASMS (Patient not taking: Reported on 01/26/2016) 60 tablet 0    oxyCODONE-acetaminophen (ROXICET) 5-325 MG tablet Take 1-2 tablets by mouth every 4 (four) hours as needed. (Patient not taking: Reported on 01/26/2016) 60 tablet 0   PRESCRIPTION MEDICATION Apply 1 application topically 2 (two) times daily as needed (irritation/sores on foot). clotrimazole betamethasone dipropionate cream     ranitidine (ZANTAC) 300 MG tablet Take 300 mg by mouth daily.     No facility-administered medications prior to visit.    PAST MEDICAL HISTORY: Past Medical History:  Diagnosis Date   Arthritis    osteoarthritis right hip/ hands   At low risk for impaired skin integrity    rash lower right leg around ankle and lower leg-Uses topical cream as needed, goes and comes for over a year"   Blood donor    Pt states"a regular blood donor" -every 3 months   Depression    GERD (gastroesophageal reflux disease)    pantoprazole controls.   GI bleeding    "ulcer"- [facility]   H/O: alcohol abuse    x 5 yrs none- then has had a few drinks in past few weeks to help with stress"   Headache    migraines"early years"-no longer a problem   Neuromuscular disorder (HCC)    Restless leg syndrome    Gabapentin helps   Transfusion history     PAST SURGICAL HISTORY: Past Surgical History:  Procedure Laterality Date   BREAST SURGERY     breast biopsy- benign   Childbirth     x1 NVD   TONSILLECTOMY     child   TOTAL HIP ARTHROPLASTY Right 11/25/2015   Procedure: RIGHT TOTAL HIP ARTHROPLASTY ANTERIOR APPROACH;  Surgeon: Kathryne Hitchhristopher Y Blackman, MD;  Location: [facility];  Service: Orthopedics;  Laterality: Right;    FAMILY HISTORY: History reviewed. No pertinent family history.  SOCIAL HISTORY: Social History   Socioeconomic History   Marital status: Married    Spouse name: Leonette MostCharles   Number of children: Not on file   Years of education: Not on file   Highest education level: Not on file  Occupational History   Not on file   Tobacco Use   Smoking status: Former    Types: Cigarettes   Smokeless tobacco: Never   Tobacco comments:    smokes 4 cigs per day-"helps with anxiety"  Vaping Use   Vaping Use: Every day  Substance and Sexual Activity   Alcohol use: No    Comment: Hx. Alcohol abuse- 5635yrs ago"has had a few drinks wine since leading up to surgery"   Drug use: No   Sexual activity: Yes    Birth control/protection: None  Other Topics Concern   Not on file  Social History Narrative   Lives with husband   Right Handed   Drinks 3-4 cups caffeine daily   Social Determinants of  Health   Financial Resource Strain: Not on file  Food Insecurity: Not on file  Transportation Needs: Not on file  Physical Activity: Not on file  Stress: Not on file  Social Connections: Not on file  Intimate Partner Violence: Not on file     PHYSICAL EXAM  GENERAL EXAM/CONSTITUTIONAL: Vitals:  Vitals:   11/03/20 1101  BP: 120/76  Pulse: 91  Weight: 145 lb (65.8 kg)  Height: 5\' 4"  (1.626 m)   Body mass index is 24.89 kg/m. Wt Readings from Last 3 Encounters:  11/03/20 145 lb (65.8 kg)  01/26/16 159 lb (72.1 kg)  11/25/15 159 lb (72.1 kg)   Patient is in no distress; well developed, nourished and groomed; neck is supple  CARDIOVASCULAR: Examination of carotid arteries is normal; no carotid bruits Regular rate and rhythm, no murmurs Examination of peripheral vascular system by observation and palpation is normal  EYES: Pupils round and reactive to light, Visual fields full to confrontation, Extraocular movements intacts,   MUSCULOSKELETAL: Gait, strength, tone, movements noted in Neurologic exam below  NEUROLOGIC: MENTAL STATUS:  awake, alert, oriented to person, place and time recent and remote memory intact normal attention and concentration language fluent, comprehension intact, naming intact fund of knowledge appropriate  CRANIAL NERVE:  2nd, 3rd, 4th, 6th - pupils equal and reactive to  light, visual fields full to confrontation, extraocular muscles intact, no nystagmus 5th - facial sensation symmetric 7th - facial strength symmetric 8th - hearing intact 9th - palate elevates symmetrically, uvula midline 11th - shoulder shrug symmetric 12th - tongue protrusion midline  MOTOR:  normal bulk and tone, full strength in the BUE, BLE  SENSORY:  normal and symmetric to light touch, pinprick, temperature, vibration  COORDINATION:  finger-nose-finger, fine finger movements normal  REFLEXES:  deep tendon reflexes present and symmetric  GAIT/STATION:  normal   DIAGNOSTIC DATA (LABS, IMAGING, TESTING) - I reviewed patient records, labs, notes, testing and imaging myself where available.  Lab Results  Component Value Date   WBC 7.5 11/28/2015   HGB 7.8 (L) 11/28/2015   HCT 24.6 (L) 11/28/2015   MCV 87.9 11/28/2015   PLT 293 11/28/2015      Component Value Date/Time   NA 141 11/26/2015 0408   K 4.1 11/26/2015 0408   CL 111 11/26/2015 0408   CO2 26 11/26/2015 0408   GLUCOSE 135 (H) 11/26/2015 0408   BUN 11 11/26/2015 0408   CREATININE 0.62 11/26/2015 0408   CALCIUM 8.7 (L) 11/26/2015 0408   GFRNONAA >60 11/26/2015 0408   GFRAA >60 11/26/2015 0408   No results found for: CHOL, HDL, LDLCALC, LDLDIRECT, TRIG, CHOLHDL No results found for: 11/28/2015 No results found for: VITAMINB12 No results found for: TSH  MRI Brain 03/29/2020: No acute intracranial abnormality.  Mild chronic microangiopathic changes.  A 2.1 cm intramuscular lesion in the left masseter may represent a vascular lesion.  ASSESSMENT AND PLAN  65 y.o. year old female with with past medical history of alcohol abuse, hyperlipidemia, anxiety depression, osteoporosis who is presenting after recent admission to the hospital for encephalopathy/TIA.  Patient reported since being home she has been having intermittent vomiting, believes that she might have have food poisoning because she had some fast food and  the vomiting started the day after.  Currently all of her symptoms are resolved, she is back to baseline.  She does however have headaches about 2-3 times per week.  She is currently not on any preventive or abortive medication.  Once  the headache starts, she takes Tylenol which can help at times.  I will start her on topiramate 25 mg nightly for 1 week then increase to 50 mg nightly.  I will see the patient in 6 months for follow-up.  All questions addressed.   1. TIA (transient ischemic attack)   2. Migraine without aura and without status migrainosus, not intractable     PLAN: Start Topiramate 1/2 tablet for one week then increase to 1 tablet  Keep well hydrated  Follow up with your other doctors  Return in 6 months or sooner if worse   No orders of the defined types were placed in this encounter.   Meds ordered this encounter  Medications   topiramate (TOPAMAX) 50 MG tablet    Sig: Take 1 tablet (50 mg total) by mouth at bedtime.    Dispense:  90 tablet    Refill:  2    Return in about 6 months (around 05/03/2021).    Windell Norfolk, MD 11/03/2020, 11:57 AM  [facility] [address] [place], [place] [address] [phone]

## 2021-05-03 ENCOUNTER — Other Ambulatory Visit: Payer: Self-pay

## 2021-05-03 ENCOUNTER — Ambulatory Visit (INDEPENDENT_AMBULATORY_CARE_PROVIDER_SITE_OTHER): Payer: Medicare Other | Admitting: Neurology

## 2021-05-03 ENCOUNTER — Encounter: Payer: Self-pay | Admitting: Neurology

## 2021-05-03 VITALS — BP 136/78 | HR 78 | Ht 64.0 in | Wt 142.0 lb

## 2021-05-03 DIAGNOSIS — G43009 Migraine without aura, not intractable, without status migrainosus: Secondary | ICD-10-CM | POA: Diagnosis not present

## 2021-05-03 DIAGNOSIS — G459 Transient cerebral ischemic attack, unspecified: Secondary | ICD-10-CM

## 2021-05-03 MED ORDER — TOPIRAMATE 100 MG PO TABS: 100.0000 mg | ORAL_TABLET | Freq: Every evening | ORAL | 3 refills | Status: DC

## 2021-05-03 MED ORDER — SUMATRIPTAN SUCCINATE 50 MG PO TABS: 50.0000 mg | ORAL_TABLET | ORAL | 3 refills | Status: DC | PRN

## 2021-05-03 MED ORDER — ONDANSETRON 4 MG PO TBDP: 4.0000 mg | ORAL_TABLET | Freq: Three times a day (TID) | ORAL | 0 refills | Status: AC | PRN

## 2021-05-03 NOTE — Patient Instructions (Signed)
Increase Topamax to 100 mg nightly  ?Start Sumatriptan 50 mg as soon as the headache start, can take an extra dose 2 hrs later if headaches not resolved.  ?Zofran 1 tablet as needed for nausea and vomiting  ?Follow up in 6 months but contact me sooner if headaches not improved.  ?

## 2021-05-03 NOTE — Progress Notes (Signed)
[facility]  PATIENT: Daisy Carson DOB: 06/28/1955  REFERRING CLINICIAN: Marylen Ponto, MD HISTORY FROM: Patient and Husband Daisy Carson REASON FOR VISIT: Recent hospitalization for TIA/Encephalopathy.    HISTORICAL  CHIEF COMPLAINT:  Chief Complaint  Patient presents with   Follow-up    Rm 12. Alone. Pt c/o 4 migraine days per month that leave her bed ridden. They last an entire day. Would like to discuss increase of Topamax.    INTERVAL HISTORY 05/03/2021 Patient presents today for follow-up, at last visit in September plan was to start Topamax for her migraine.  She is currently taking Topamax 50 mg daily but reports about 1 headache per week.  Usually when he gets the headaches, it last the whole day and the pain is more on the left eye.  She reports this is her typical migraine.  She has not tried any abortive medication except Tylenol PM.  She reported her doctor had given her a sample for Nurtec but she experienced bad diarrhea with the medication does self discontinued   HISTORY OF PRESENT ILLNESS:  This is a 66 year old woman with past medical history of alcohol abuse, osteoporosis, hyperlipidemia, headaches who is presenting after hospitalization for possible TIA and encephalopathy.  History obtained via chart review and from husband.  Her husband mentioned that on July 20th, patient was working in the garden when she came inside the house she looks flushed, she was able to go to her meeting that day and when she got back in the house she was confused with she was not making any sense. She was observed but her shoes in the dishwasher.  She was able to rest and go to sleep.  Husband noted from July 20 to the 27 she had intermittent confusion and on the 27th she was not making any sense, was acting strange and has some weakness therefore he called EMS.  Patient was taken to a local hospital and admitted.  During this admission, she had no clear source of  infection, she had a MRI brain which was negative, she also had a CTA of the head and neck which did not show any occlusion.  They performed a EEG which did not show any seizure activity and no epileptiform discharge.  She also have echocardiogram which was normal she was discharged home and she was felt to have at the TIA versus heat exhaustion versus encephalopathy of unknown etiology.   Husband also has mentioned that in early August patient had food from fast food and believes that she may have had a food poisoning because since then she has been having vomiting, cannot keep anything down until August 27.  He has been keeping a diary.  On August 28 husband noted that she has been back to his to her normal self, no more vomiting.  Patient also noted she had a history of migraine headache usually will get to 3 headache per week headaches is left on the left as the left side of her face behind the left eye described the pain as sharp pain, sometimes she will see a spot.  When she used to take Tylenol for headaches.  At time headaches can be bad associated with nausea with photophobia as an seeing spots.  She is currently not on any preventive or abortive medication.   OTHER MEDICAL CONDITIONS: Headaches, AA meeting recovering from alcohol (had a relapse in January), Depression, GERD, osteoporosis, HLD,    REVIEW OF SYSTEMS: Full 14 system review of  systems performed and negative with exception of: As noted in the HPI  ALLERGIES: Allergies  Allergen Reactions   Nsaids     Other reaction(s): Other (See Comments) Contraindicated due to GI bleed   Nurtec [Rimegepant Sulfate] Diarrhea    HOME MEDICATIONS: Outpatient Medications Prior to Visit  Medication Sig Dispense Refill   acetaminophen (TYLENOL) 650 MG CR tablet Take 1,300 mg by mouth daily as needed for pain.     alendronate (FOSAMAX) 70 MG tablet Take 1 tablet by mouth once a week.     ARIPiprazole (ABILIFY) 5 MG tablet Take 1 tablet by  mouth daily.     atorvastatin (LIPITOR) 20 MG tablet Take 20 mg by mouth daily.     Boswellia-Glucosamine-Vit D (OSTEO BI-FLEX ONE PER DAY PO) Take 1 tablet by mouth daily.     FLUoxetine (PROZAC) 20 MG capsule Take 1 capsule by mouth daily.     gabapentin (NEURONTIN) 600 MG tablet Take 600 mg by mouth 3 (three) times daily.     Melatonin 10 MG TABS Take 1 tablet by mouth at bedtime.     Misc Natural Products (OSTEO BI-FLEX TRIPLE STRENGTH PO) Take 2 tablets by mouth daily.     Multiple Vitamins-Minerals (CENTRUM SILVER PO) Take 1 tablet by mouth daily.     pantoprazole (PROTONIX) 40 MG tablet Take 40 mg by mouth daily.     topiramate (TOPAMAX) 50 MG tablet Take 1 tablet (50 mg total) by mouth at bedtime. 90 tablet 2   No facility-administered medications prior to visit.    PAST MEDICAL HISTORY: Past Medical History:  Diagnosis Date   Arthritis    osteoarthritis right hip/ hands   At low risk for impaired skin integrity    rash lower right leg around ankle and lower leg-Uses topical cream as needed, goes and comes for over a year"   Blood donor    Pt states"a regular blood donor" -every 3 months   Depression    GERD (gastroesophageal reflux disease)    pantoprazole controls.   GI bleeding    "ulcer"- required 4 units Blood- 4'14 [facility]   H/O: alcohol abuse    x 5 yrs none- then has had a few drinks in past few weeks to help with stress"   Headache    migraines"early years"-no longer a problem   Neuromuscular disorder (HCC)    Restless leg syndrome    Gabapentin helps   Transfusion history     PAST SURGICAL HISTORY: Past Surgical History:  Procedure Laterality Date   BREAST SURGERY     breast biopsy- benign   Childbirth     x1 NVD   TONSILLECTOMY     child   TOTAL HIP ARTHROPLASTY Right 11/25/2015   Procedure: RIGHT TOTAL HIP ARTHROPLASTY ANTERIOR APPROACH;  Surgeon: Kathryne Hitch, MD;  Location: [facility];  Service: Orthopedics;  Laterality:  Right;    FAMILY HISTORY: History reviewed. No pertinent family history.  SOCIAL HISTORY: Social History   Socioeconomic History   Marital status: Married    Spouse name: Daisy Carson   Number of children: Not on file   Years of education: Not on file   Highest education level: Not on file  Occupational History   Not on file  Tobacco Use   Smoking status: Former    Types: Cigarettes   Smokeless tobacco: Never   Tobacco comments:    smokes 4 cigs per day-"helps with anxiety"  Vaping Use   Vaping Use:  Every day  Substance and Sexual Activity   Alcohol use: No    Comment: Hx. Alcohol abuse- 73yrs ago"has had a few drinks wine since leading up to surgery"   Drug use: No   Sexual activity: Yes    Birth control/protection: None  Other Topics Concern   Not on file  Social History Narrative   Lives with husband   Right Handed   Drinks 3-4 cups caffeine daily   Social Determinants of Health   Financial Resource Strain: Not on file  Food Insecurity: Not on file  Transportation Needs: Not on file  Physical Activity: Not on file  Stress: Not on file  Social Connections: Not on file  Intimate Partner Violence: Not on file     PHYSICAL EXAM  GENERAL EXAM/CONSTITUTIONAL: Vitals:  Vitals:   05/03/21 1117  BP: 136/78  Pulse: 78  Weight: 142 lb (64.4 kg)  Height:  (1.626 m)   Body mass index is 24.37 kg/m. Wt Readings from Last 3 Encounters:  05/03/21 142 lb (64.4 kg)  11/03/20 145 lb (65.8 kg)  01/26/16 159 lb (72.1 kg)   Patient is in no distress; well developed, nourished and groomed; neck is supple  CARDIOVASCULAR: Examination of carotid arteries is normal; no carotid bruits Regular rate and rhythm, no murmurs Examination of peripheral vascular system by observation and palpation is normal  EYES: Pupils round and reactive to light, Visual fields full to confrontation, Extraocular movements intacts,   MUSCULOSKELETAL: Gait, strength, tone, movements  noted in Neurologic exam below  NEUROLOGIC: MENTAL STATUS:  awake, alert, oriented to person, place and time recent and remote memory intact normal attention and concentration language fluent, comprehension intact, naming intact fund of knowledge appropriate  CRANIAL NERVE:  2nd, 3rd, 4th, 6th - pupils equal and reactive to light, visual fields full to confrontation, extraocular muscles intact, no nystagmus 5th - facial sensation symmetric 7th - facial strength symmetric 8th - hearing intact 9th - palate elevates symmetrically, uvula midline 11th - shoulder shrug symmetric 12th - tongue protrusion midline  MOTOR:  normal bulk and tone, full strength in the BUE, BLE  SENSORY:  normal and symmetric to light touch, pinprick, temperature, vibration  COORDINATION:  finger-nose-finger, fine finger movements normal  REFLEXES:  deep tendon reflexes present and symmetric  GAIT/STATION:  normal   DIAGNOSTIC DATA (LABS, IMAGING, TESTING) - I reviewed patient records, labs, notes, testing and imaging myself where available.  Lab Results  Component Value Date   WBC 7.5 11/28/2015   HGB 7.8 (L) 11/28/2015   HCT 24.6 (L) 11/28/2015   MCV 87.9 11/28/2015   PLT 293 11/28/2015      Component Value Date/Time   NA 141 11/26/2015 0408   K 4.1 11/26/2015 0408   CL 111 11/26/2015 0408   CO2 26 11/26/2015 0408   GLUCOSE 135 (H) 11/26/2015 0408   BUN 11 11/26/2015 0408   CREATININE 0.62 11/26/2015 0408   CALCIUM 8.7 (L) 11/26/2015 0408   GFRNONAA >60 11/26/2015 0408   GFRAA >60 11/26/2015 0408   No results found for: CHOL, HDL, LDLCALC, LDLDIRECT, TRIG, CHOLHDL No results found for: ZOXW9U No results found for: VITAMINB12 No results found for: TSH   MRI Brain 03/29/2020: No acute intracranial abnormality.  Mild chronic microangiopathic changes.  A 2.1 cm intramuscular lesion in the left masseter may represent a vascular lesion.  ASSESSMENT AND PLAN  66 y.o. year old female  with with past medical history of alcohol abuse, hyperlipidemia, anxiety depression,  osteoporosis who is presenting for follow-up for migraine headaches, currently she is getting 1 migraine per week, she is on topiramate 50 mg nightly and no abortive medication.  Migraine usually last the whole day.  At this time I think is reasonable to increase the Topamax to 100 mg nightly and to start sumatriptan and 50 mg as abortive medication.  She does report nausea with her migraines therefore I will start her on Zofran as needed.  I also advised the patient to contact me if the increase in topiramate is not helping controlling her migraines frequency. Otherwise I will see her in 6 months for follow-up   1. Migraine without aura and without status migrainosus, not intractable   2. TIA (transient ischemic attack)      Patient Instructions  Increase Topamax to 100 mg nightly  Start Sumatriptan 50 mg as soon as the headache start, can take an extra dose 2 hrs later if headaches not resolved.  Zofran 1 tablet as needed for nausea and vomiting  Follow up in 6 months but contact me sooner if headaches not improved.     No orders of the defined types were placed in this encounter.    Meds ordered this encounter  Medications   topiramate (TOPAMAX) 100 MG tablet    Sig: Take 1 tablet (100 mg total) by mouth at bedtime.    Dispense:  90 tablet    Refill:  3   SUMAtriptan (IMITREX) 50 MG tablet    Sig: Take 1 tablet (50 mg total) by mouth every 2 (two) hours as needed for migraine. May repeat in 2 hours if headache persists or recurs.    Dispense:  10 tablet    Refill:  3   ondansetron (ZOFRAN-ODT) 4 MG disintegrating tablet    Sig: Take 1 tablet (4 mg total) by mouth every 8 (eight) hours as needed for nausea or vomiting.    Dispense:  20 tablet    Refill:  0    Return in about 6 months (around 11/03/2021).    Windell NorfolkAmadou Jonan Seufert, MD 05/03/2021, 4:55 PM  [facility] [address],  [address] [place], [place] [id number] [phone]

## 2021-11-08 ENCOUNTER — Ambulatory Visit (INDEPENDENT_AMBULATORY_CARE_PROVIDER_SITE_OTHER): Payer: Medicare Other | Admitting: Neurology

## 2021-11-08 ENCOUNTER — Encounter: Payer: Self-pay | Admitting: Neurology

## 2021-11-08 VITALS — BP 110/73 | HR 82 | Ht 61.0 in | Wt 141.0 lb

## 2021-11-08 DIAGNOSIS — G43009 Migraine without aura, not intractable, without status migrainosus: Secondary | ICD-10-CM

## 2021-11-08 MED ORDER — TOPIRAMATE 100 MG PO TABS: 100.0000 mg | ORAL_TABLET | Freq: Every evening | ORAL | 3 refills | Status: DC

## 2021-11-08 NOTE — Patient Instructions (Signed)
Continue topiramate 100 mg nightly Continue with sumatriptan as needed for headaches Continue to follow with PCP Return in 1 year or sooner if worse

## 2021-11-08 NOTE — Progress Notes (Signed)
[facility]  PATIENT: Daisy Carson DOB: 11/07/1955  REFERRING CLINICIAN: Marylen Ponto, MD HISTORY FROM: Patient and Husband [name] REASON FOR VISIT: Recent hospitalization for TIA/Encephalopathy.    HISTORICAL  CHIEF COMPLAINT:  Chief Complaint  Patient presents with   Follow-up    Rm 12 alone Pt is well, migraines have been manageable with medication.  No new TIA concerns    INTERVAL HISTORY 11/08/2021 Patient presents today for follow-up, reports headaches are much better, it went from once a week to about  1/month or less.  She is compliant with the Topamax,  if she has headache she usually takes over-the-counter medication, she has not been taking the sumatriptan.  No other complaints.  She has noted some word finding difficulty but felt like it was secondary to the gabapentin, she is on gabapentin 600 mg 3 times daily.      INTERVAL HISTORY 05/03/2021 Patient presents today for follow-up, at last visit in September plan was to start Topamax for her migraine.  She is currently taking Topamax 50 mg daily but reports about 1 headache per week.  Usually when he gets the headaches, it last the whole day and the pain is more on the left eye.  She reports this is her typical migraine.  She has not tried any abortive medication except Tylenol PM.  She reported her doctor had given her a sample for Nurtec but she experienced bad diarrhea with the medication does self discontinued   HISTORY OF PRESENT ILLNESS:  This is a 66 year old woman with past medical history of alcohol abuse, osteoporosis, hyperlipidemia, headaches who is presenting after hospitalization for possible TIA and encephalopathy.  History obtained via chart review and from husband.  Her husband mentioned that on July 20th, patient was working in the garden when she came inside the house she looks flushed, she was able to go to her meeting that day and when she got back in the house she was  confused with she was not making any sense. She was observed but her shoes in the dishwasher.  She was able to rest and go to sleep.  Husband noted from July 20 to the 27 she had intermittent confusion and on the 27th she was not making any sense, was acting strange and has some weakness therefore he called EMS.  Patient was taken to a local hospital and admitted.  During this admission, she had no clear source of infection, she had a MRI brain which was negative, she also had a CTA of the head and neck which did not show any occlusion.  They performed a EEG which did not show any seizure activity and no epileptiform discharge.  She also have echocardiogram which was normal she was discharged home and she was felt to have at the TIA versus heat exhaustion versus encephalopathy of unknown etiology.   Husband also has mentioned that in early August patient had food from fast food and believes that she may have had a food poisoning because since then she has been having vomiting, cannot keep anything down until August 27.  He has been keeping a diary.  On August 28 husband noted that she has been back to his to her normal self, no more vomiting.  Patient also noted she had a history of migraine headache usually will get to 3 headache per week headaches is left on the left as the left side of her face behind the left eye described the pain as sharp  pain, sometimes she will see a spot.  When she used to take Tylenol for headaches.  At time headaches can be bad associated with nausea with photophobia as an seeing spots.  She is currently not on any preventive or abortive medication.   OTHER MEDICAL CONDITIONS: Headaches, AA meeting recovering from alcohol (had a relapse in January), Depression, GERD, osteoporosis, HLD,    REVIEW OF SYSTEMS: Full 14 system review of systems performed and negative with exception of: As noted in the HPI  ALLERGIES: Allergies  Allergen Reactions   Nsaids     Other reaction(s):  Other (See Comments) Contraindicated due to GI bleed   Nurtec [Rimegepant Sulfate] Diarrhea    HOME MEDICATIONS: Outpatient Medications Prior to Visit  Medication Sig Dispense Refill   acetaminophen (TYLENOL) 650 MG CR tablet Take 1,300 mg by mouth daily as needed for pain.     alendronate (FOSAMAX) 70 MG tablet Take 1 tablet by mouth once a week.     ARIPiprazole (ABILIFY) 5 MG tablet Take 1 tablet by mouth daily.     atorvastatin (LIPITOR) 20 MG tablet Take 20 mg by mouth daily.     Boswellia-Glucosamine-Vit D (OSTEO BI-FLEX ONE PER DAY PO) Take 1 tablet by mouth daily.     FLUoxetine (PROZAC) 20 MG capsule Take 1 capsule by mouth daily.     gabapentin (NEURONTIN) 600 MG tablet Take 600 mg by mouth 3 (three) times daily.     Melatonin 10 MG TABS Take 1 tablet by mouth at bedtime.     Misc Natural Products (OSTEO BI-FLEX TRIPLE STRENGTH PO) Take 2 tablets by mouth daily.     Multiple Vitamins-Minerals (CENTRUM SILVER PO) Take 1 tablet by mouth daily.     ondansetron (ZOFRAN-ODT) 4 MG disintegrating tablet Take 1 tablet (4 mg total) by mouth every 8 (eight) hours as needed for nausea or vomiting. 20 tablet 0   pantoprazole (PROTONIX) 40 MG tablet Take 40 mg by mouth daily.     SUMAtriptan (IMITREX) 50 MG tablet Take 1 tablet (50 mg total) by mouth every 2 (two) hours as needed for migraine. May repeat in 2 hours if headache persists or recurs. 10 tablet 3   topiramate (TOPAMAX) 100 MG tablet Take 1 tablet (100 mg total) by mouth at bedtime. 90 tablet 3   No facility-administered medications prior to visit.    PAST MEDICAL HISTORY: Past Medical History:  Diagnosis Date   Arthritis    osteoarthritis right hip/ hands   At low risk for impaired skin integrity    rash lower right leg around ankle and lower leg-Uses topical cream as needed, goes and comes for over a year"   Blood donor    Pt states"a regular blood donor" -every 3 months   Depression    GERD (gastroesophageal reflux  disease)    pantoprazole controls.   GI bleeding    "ulcer"- required 4 units Blood- [facility]   H/O: alcohol abuse    x 5 yrs none- then has had a few drinks in past few weeks to help with stress"   Headache    migraines"early years"-no longer a problem   Neuromuscular disorder (HCC)    Restless leg syndrome    Gabapentin helps   Transfusion history     PAST SURGICAL HISTORY: Past Surgical History:  Procedure Laterality Date   BREAST SURGERY     breast biopsy- benign   Childbirth     x1 NVD   TONSILLECTOMY  child   TOTAL HIP ARTHROPLASTY Right 11/25/2015   Procedure: RIGHT TOTAL HIP ARTHROPLASTY ANTERIOR APPROACH;  Surgeon: Kathryne Hitch, MD;  Location: [facility];  Service: Orthopedics;  Laterality: Right;    FAMILY HISTORY: History reviewed. No pertinent family history.  SOCIAL HISTORY: Social History   Socioeconomic History   Marital status: Married    Spouse name: [name]   Number of children: Not on file   Years of education: Not on file   Highest education level: Not on file  Occupational History   Not on file  Tobacco Use   Smoking status: Former    Types: Cigarettes   Smokeless tobacco: Never   Tobacco comments:    smokes 4 cigs per day-"helps with anxiety"  Vaping Use   Vaping Use: Every day  Substance and Sexual Activity   Alcohol use: No    Comment: Hx. Alcohol abuse- 60yrs ago"has had a few drinks wine since leading up to surgery"   Drug use: No   Sexual activity: Yes    Birth control/protection: None  Other Topics Concern   Not on file  Social History Narrative   Lives with husband   Right Handed   Drinks 3-4 cups caffeine daily   Social Determinants of Health   Financial Resource Strain: Not on file  Food Insecurity: Not on file  Transportation Needs: Not on file  Physical Activity: Not on file  Stress: Not on file  Social Connections: Not on file  Intimate Partner Violence: Not on file     PHYSICAL  EXAM  GENERAL EXAM/CONSTITUTIONAL: Vitals:  Vitals:   11/08/21 1110  BP: 110/73  Pulse: 82  Weight: 141 lb (64 kg)  Height: 5\' 1"  (1.549 m)   Body mass index is 26.64 kg/m. Wt Readings from Last 3 Encounters:  11/08/21 141 lb (64 kg)  05/03/21 142 lb (64.4 kg)  11/03/20 145 lb (65.8 kg)   Patient is in no distress; well developed, nourished and groomed; neck is supple  EYES: Pupils round and reactive to light, Visual fields full to confrontation, Extraocular movements intacts,   MUSCULOSKELETAL: Gait, strength, tone, movements noted in Neurologic exam below  NEUROLOGIC: MENTAL STATUS:  awake, alert, oriented to person, place and time recent and remote memory intact normal attention and concentration language fluent, comprehension intact, naming intact fund of knowledge appropriate  CRANIAL NERVE:  2nd, 3rd, 4th, 6th - pupils equal and reactive to light, visual fields full to confrontation, extraocular muscles intact, no nystagmus 5th - facial sensation symmetric 7th - facial strength symmetric 8th - hearing intact 9th - palate elevates symmetrically, uvula midline 11th - shoulder shrug symmetric 12th - tongue protrusion midline  MOTOR:  normal bulk and tone, full strength in the BUE, BLE  SENSORY:  normal and symmetric to light touch, pinprick, temperature, vibration  COORDINATION:  finger-nose-finger, fine finger movements normal  REFLEXES:  deep tendon reflexes present and symmetric  GAIT/STATION:  normal   DIAGNOSTIC DATA (LABS, IMAGING, TESTING) - I reviewed patient records, labs, notes, testing and imaging myself where available.  Lab Results  Component Value Date   WBC 7.5 11/28/2015   HGB 7.8 (L) 11/28/2015   HCT 24.6 (L) 11/28/2015   MCV 87.9 11/28/2015   PLT 293 11/28/2015      Component Value Date/Time   NA 141 11/26/2015 0408   K 4.1 11/26/2015 0408   CL 111 11/26/2015 0408   CO2 26 11/26/2015 0408   GLUCOSE 135 (H) 11/26/2015  0408  BUN 11 11/26/2015 0408   CREATININE 0.62 11/26/2015 0408   CALCIUM 8.7 (L) 11/26/2015 0408   GFRNONAA >60 11/26/2015 0408   GFRAA >60 11/26/2015 0408   No results found for: "CHOL", "HDL", "LDLCALC", "LDLDIRECT", "TRIG", "CHOLHDL" No results found for: "HGBA1C" No results found for: "VITAMINB12" No results found for: "TSH"   MRI Brain 03/29/2020: No acute intracranial abnormality.  Mild chronic microangiopathic changes.  A 2.1 cm intramuscular lesion in the left masseter may represent a vascular lesion.  ASSESSMENT AND PLAN  66 y.o. year old female with with past medical history of alcohol abuse, hyperlipidemia, anxiety depression, osteoporosis who is presenting for follow-up for migraine headaches.  She reports that her migraine frequency has markedly improved, currently she is getting 1 headache or less per month and over-the-counter medication is helpful.  She is compliant with the topiramate 100 mg at nighttime.  On exam, she was noted to have word finding difficulty but she felt it was secondary to the gabapentin.  I informed patient that Topamax is known to cause brain fog and word finding difficulty, but will continue to monitor her and if she feels like the episodes are getting worse, we can try to switch her on an extended release version.  At this time I will continue the patient on the same regimen and I will see her in 1 year for follow-up, she can follow-up with NP in the future.    1. Migraine without aura and without status migrainosus, not intractable     Patient Instructions  Continue topiramate 100 mg nightly Continue with sumatriptan as needed for headaches Continue to follow with PCP Return in 1 year or sooner if worse    No orders of the defined types were placed in this encounter.    Meds ordered this encounter  Medications   topiramate (TOPAMAX) 100 MG tablet    Sig: Take 1 tablet (100 mg total) by mouth at bedtime.    Dispense:  90 tablet     Refill:  3    Return in about 1 year (around 11/09/2022).  I have spent a total of 30 minutes dedicated to this patient today, preparing to see patient, performing a medically appropriate examination and evaluation, ordering tests and/or medications and procedures, and counseling and educating the patient/family/caregiver; independently interpreting result and communicating results to the family/patient/caregiver; and documenting clinical information in the electronic medical record.   Windell Norfolk, MD 11/08/2021, 11:29 AM  [facility] [address], [address] [place], [place] [address] [phone]

## 2022-01-01 ENCOUNTER — Ambulatory Visit (INDEPENDENT_AMBULATORY_CARE_PROVIDER_SITE_OTHER): Payer: Medicare Other

## 2022-01-01 ENCOUNTER — Ambulatory Visit (INDEPENDENT_AMBULATORY_CARE_PROVIDER_SITE_OTHER): Payer: Medicare Other | Admitting: Physician Assistant

## 2022-01-01 ENCOUNTER — Encounter: Payer: Self-pay | Admitting: Physician Assistant

## 2022-01-01 DIAGNOSIS — M1712 Unilateral primary osteoarthritis, left knee: Secondary | ICD-10-CM

## 2022-01-01 DIAGNOSIS — M25562 Pain in left knee: Secondary | ICD-10-CM | POA: Diagnosis not present

## 2022-01-01 DIAGNOSIS — M25552 Pain in left hip: Secondary | ICD-10-CM | POA: Diagnosis not present

## 2022-01-01 DIAGNOSIS — G8929 Other chronic pain: Secondary | ICD-10-CM

## 2022-01-01 DIAGNOSIS — M7062 Trochanteric bursitis, left hip: Secondary | ICD-10-CM

## 2022-01-01 MED ORDER — LIDOCAINE HCL 1 % IJ SOLN
3.0000 mL | INTRAMUSCULAR | Status: AC | PRN
  Administered 2022-01-01: 3 mL

## 2022-01-01 MED ORDER — METHYLPREDNISOLONE ACETATE 40 MG/ML IJ SUSP
40.0000 mg | INTRAMUSCULAR | Status: AC | PRN
  Administered 2022-01-01: 40 mg via INTRA_ARTICULAR

## 2022-01-01 NOTE — Progress Notes (Signed)
Office Visit Note   Patient: Daisy Carson           Date of Birth: 04/19/1955           MRN: [id number] Visit Date: 01/01/2022              Requested by: Daisy Ponto, MD [address] [place] [id number],  PCP: Daisy Ponto, MD   Assessment & Plan: Visit Diagnoses:  1. Primary osteoarthritis of left knee   2. Trochanteric bursitis of left hip     Plan: Patient shown IT band stretching.  We will have her follow-up with [place] as needed.  In regards to her knee she wants to make an appointment in the future to possibly discuss left total knee arthroplasty.  Follow-Up Instructions: No follow-ups on file.   Orders:  Orders Placed This Encounter  Procedures   Large Joint Inj   XR HIP UNILAT W OR W/O PELVIS 1V LEFT   XR Knee 1-2 Views Left   No orders of the defined types were placed in this encounter.     Procedures: Large Joint Inj: L greater trochanter on 01/01/2022 2:14 PM Indications: pain Details: 22 G 1.5 in needle, lateral approach  Arthrogram: No  Medications: 3 mL lidocaine 1 %; 40 mg methylPREDNISolone acetate 40 MG/ML Outcome: tolerated well, no immediate complications Procedure, treatment alternatives, risks and benefits explained, specific risks discussed. Consent was given by the patient. Immediately prior to procedure a time out was called to verify the correct patient, procedure, equipment, support staff and site/side marked as required. Patient was prepped and draped in the usual sterile fashion.       Clinical Data: No additional findings.   Subjective: Chief Complaint  Patient presents with   Left Knee - Pain   Left Hip - Pain    HPI Ms. Daisy Carson comes in today due to left hip pain.  She states she is having pain lateral aspect right hip whenever she is walking or driving.  Denies any groin pain.  She states her hip pain began after she had an injection for her left knee in October at another facility.  She wonders if this is due  to over compensation.  No radicular symptoms.    Review of Systems  Constitutional:  Negative for chills and fever.     Objective: Vital Signs: There were no vitals taken for this visit.  Physical Exam Constitutional:      Appearance: She is not ill-appearing or diaphoretic.  Pulmonary:     Effort: Pulmonary effort is normal.  Neurological:     Mental Status: She is alert.  Psychiatric:        Mood and Affect: Mood normal.     Ortho Exam Bilateral hips: Good range of motion bilateral hips without pain.  Tenderness over the left hip trochanteric region down the IT band. Left knee: Good range of motion.  Valgus malalignment.  No abnormal warmth erythema or effusion.  Specialty Comments:  No specialty comments available.  Imaging: XR HIP UNILAT W OR W/O PELVIS 1V LEFT  Result Date: 01/01/2022 AP pelvis lateral view left hip: Bilateral hips well located.  Status post right total hip arthroplasty with no apparent complications.  Left hip joint is well-preserved.  No acute fractures or bony abnormalities.  XR Knee 1-2 Views Left  Result Date: 01/01/2022 Left knee 2 views: Bone-on-bone lateral compartment.  Severe patellofemoral arthritic changes.  No acute fracture.  Knee is well located.  Valgus malalignment.     PMFS History: Patient Active Problem List   Diagnosis Date Noted   Chronic pain of right knee 04/26/2020   ETOH abuse 03/19/2020   Osteoarthritis of right hip 11/25/2015   Status post total replacement of right hip 11/25/2015   Fibrocystic breast changes of both breasts 11/08/2015   Past Medical History:  Diagnosis Date   Arthritis    osteoarthritis right hip/ hands   At low risk for impaired skin integrity    rash lower right leg around ankle and lower leg-Uses topical cream as needed, goes and comes for over a year"   Blood donor    Pt states"a regular blood donor" -every 3 months   Depression    GERD (gastroesophageal reflux disease)    pantoprazole  controls.   GI bleeding    "ulcer"- required 4 units Blood- [facility]   H/O: alcohol abuse    x 5 yrs none- then has had a few drinks in past few weeks to help with stress"   Headache    migraines"early years"-no longer a problem   Neuromuscular disorder (HCC)    Restless leg syndrome    Gabapentin helps   Transfusion history     No family history on file.  Past Surgical History:  Procedure Laterality Date   BREAST SURGERY     breast biopsy- benign   Childbirth     x1 NVD   TONSILLECTOMY     child   TOTAL HIP ARTHROPLASTY Right 11/25/2015   Procedure: RIGHT TOTAL HIP ARTHROPLASTY ANTERIOR APPROACH;  Surgeon: Mcarthur Rossetti, MD;  Location: [facility];  Service: Orthopedics;  Laterality: Right;   Social History   Occupational History   Not on file  Tobacco Use   Smoking status: Former    Types: Cigarettes   Smokeless tobacco: Never   Tobacco comments:    smokes 4 cigs per day-"helps with anxiety"  Vaping Use   Vaping Use: Every day  Substance and Sexual Activity   Alcohol use: No    Comment: Hx. Alcohol abuse- 53yrs ago"has had a few drinks wine since leading up to surgery"   Drug use: No   Sexual activity: Yes    Birth control/protection: None

## 2022-01-03 ENCOUNTER — Telehealth: Payer: Self-pay | Admitting: Physician Assistant

## 2022-01-03 NOTE — Telephone Encounter (Signed)
Pt called requesting a call back from [place] or [place].M. Pt states she had an appt yesterday and she was in so much pain she for got the exercises and how often [facility] [place] want her to do and which exercises are they. Pt phone number is [phone].

## 2022-03-20 HISTORY — PX: KNEE SURGERY: SHX244

## 2022-05-03 ENCOUNTER — Encounter: Payer: Self-pay | Admitting: Radiology

## 2022-11-07 ENCOUNTER — Encounter: Payer: Self-pay | Admitting: Neurology

## 2022-11-07 ENCOUNTER — Ambulatory Visit (INDEPENDENT_AMBULATORY_CARE_PROVIDER_SITE_OTHER): Payer: Medicare Other | Admitting: Neurology

## 2022-11-07 VITALS — BP 114/77 | HR 101 | Ht 64.0 in | Wt 144.5 lb

## 2022-11-07 DIAGNOSIS — G43709 Chronic migraine without aura, not intractable, without status migrainosus: Secondary | ICD-10-CM | POA: Diagnosis not present

## 2022-11-07 MED ORDER — SUMATRIPTAN SUCCINATE 50 MG PO TABS: 50.0000 mg | ORAL_TABLET | ORAL | 3 refills | Status: DC | PRN

## 2022-11-07 MED ORDER — TOPIRAMATE 100 MG PO TABS: 100.0000 mg | ORAL_TABLET | Freq: Every evening | ORAL | 3 refills | Status: DC

## 2022-11-07 NOTE — Progress Notes (Signed)
Patient: Daisy Carson Date of Birth: 1955/11/10  Reason for Visit: Follow up History from: Patient Primary Neurologist: Camara  ASSESSMENT AND PLAN 67 y.o. year old female   1.  Chronic migraine headache  -Continue Topamax 100 mg nightly for migraine prevention -Continue Imitrex 50 mg as needed for acute headache treatment -May discuss lowering dose of gabapentin with PCP, reports some drowsiness -Follow-up with me in 1 year or sooner if needed  HISTORY OF PRESENT ILLNESS: Today 11-20-22 Her sister passed away after long battle with lung cancer. This didn't trigger a migraine. On average 1 migraine every 6-8 weeks. Remains on Topamax 100 mg at bedtime. Takes Imitrex as needed, rarely takes it. An ice pack and Tylenol works well for her. She does feel sleepy at night after dinner. Mentions on gabapentin 600 mg TID for pain in her legs, causing her drowsiness.  Reviewed labs from PCP April 2024, CBC normal, creatinine 0.8, AST 18, ALT 12, chloride 109.  HISTORY  INTERVAL HISTORY 11/08/2021 Patient presents today for follow-up, reports headaches are much better, it went from once a week to about  1/month or less.  She is compliant with the Topamax,  if she has headache she usually takes over-the-counter medication, she has not been taking the sumatriptan.  No other complaints.  She has noted some word finding difficulty but felt like it was secondary to the gabapentin, she is on gabapentin 600 mg 3 times daily.     INTERVAL HISTORY 05/03/2021 Patient presents today for follow-up, at last visit in September plan was to start Topamax for her migraine.  She is currently taking Topamax 50 mg daily but reports about 1 headache per week.  Usually when he gets the headaches, it last the whole day and the pain is more on the left eye.  She reports this is her typical migraine.  She has not tried any abortive medication except Tylenol PM.  She reported her doctor had given her a sample for Nurtec  but she experienced bad diarrhea with the medication does self discontinued   HISTORY OF PRESENT ILLNESS:  This is a 67 year old woman with past medical history of alcohol abuse, osteoporosis, hyperlipidemia, headaches who is presenting after hospitalization for possible TIA and encephalopathy.  History obtained via chart review and from husband.  Her husband mentioned that on July 20th, patient was working in the garden when she came inside the house she looks flushed, she was able to go to her meeting that day and when she got back in the house she was confused with she was not making any sense. She was observed but her shoes in the dishwasher.  She was able to rest and go to sleep.  Husband noted from July 20 to the 27 she had intermittent confusion and on the 27th she was not making any sense, was acting strange and has some weakness therefore he called EMS.  Patient was taken to a local hospital and admitted.  During this admission, she had no clear source of infection, she had a MRI brain which was negative, she also had a CTA of the head and neck which did not show any occlusion.  They performed a EEG which did not show any seizure activity and no epileptiform discharge.  She also have echocardiogram which was normal she was discharged home and she was felt to have at the TIA versus heat exhaustion versus encephalopathy of unknown etiology.   Husband also has mentioned that in early August  patient had food from fast food and believes that she may have had a food poisoning because since then she has been having vomiting, cannot keep anything down until August 27.  He has been keeping a diary.  On August 28 husband noted that she has been back to his to her normal self, no more vomiting.  Patient also noted she had a history of migraine headache usually will get to 3 headache per week headaches is left on the left as the left side of her face behind the left eye described the pain as sharp pain, sometimes  she will see a spot.  When she used to take Tylenol for headaches.  At time headaches can be bad associated with nausea with photophobia as an seeing spots.  She is currently not on any preventive or abortive medication.  REVIEW OF SYSTEMS: Out of a complete 14 system review of symptoms, the patient complains only of the following symptoms, and all other reviewed systems are negative.  See HPI  ALLERGIES: Allergies  Allergen Reactions   Nsaids     Other reaction(s): Other (See Comments)  Contraindicated due to GI bleed  Other Reaction(s): Other (See Comments)  Other reaction(s): Other (See Comments)  Contraindicated due to GI bleed  Contraindicated due to GI bleed   Nurtec [Rimegepant Sulfate] Diarrhea    HOME MEDICATIONS: Outpatient Medications Prior to Visit  Medication Sig Dispense Refill   acetaminophen (TYLENOL) 650 MG CR tablet Take 1,300 mg by mouth daily as needed for pain.     alendronate (FOSAMAX) 70 MG tablet Take 1 tablet by mouth once a week.     ARIPiprazole (ABILIFY) 5 MG tablet Take 1 tablet by mouth daily.     atorvastatin (LIPITOR) 20 MG tablet Take 20 mg by mouth daily.     Boswellia-Glucosamine-Vit D (OSTEO BI-FLEX ONE PER DAY PO) Take 1 tablet by mouth daily.     FLUoxetine (PROZAC) 20 MG capsule Take 1 capsule by mouth daily.     gabapentin (NEURONTIN) 600 MG tablet Take 600 mg by mouth 3 (three) times daily.     Melatonin 10 MG TABS Take 1 tablet by mouth at bedtime.     Misc Natural Products (OSTEO BI-FLEX TRIPLE STRENGTH PO) Take 2 tablets by mouth daily.     Multiple Vitamins-Minerals (CENTRUM SILVER PO) Take 1 tablet by mouth daily.     ondansetron (ZOFRAN-ODT) 4 MG disintegrating tablet Take 1 tablet (4 mg total) by mouth every 8 (eight) hours as needed for nausea or vomiting. 20 tablet 0   pantoprazole (PROTONIX) 40 MG tablet Take 40 mg by mouth daily.     SUMAtriptan (IMITREX) 50 MG tablet Take 1 tablet (50 mg total) by mouth every 2 (two) hours as  needed for migraine. May repeat in 2 hours if headache persists or recurs. 10 tablet 3   topiramate (TOPAMAX) 100 MG tablet Take 1 tablet (100 mg total) by mouth at bedtime. 90 tablet 3   No facility-administered medications prior to visit.    PAST MEDICAL HISTORY: Past Medical History:  Diagnosis Date   Arthritis    osteoarthritis right hip/ hands   At low risk for impaired skin integrity    rash lower right leg around ankle and lower leg-Uses topical cream as needed, goes and comes for over a year"   Blood donor    Pt states"a regular blood donor" -every 3 months   Depression    GERD (gastroesophageal reflux disease)  pantoprazole controls.   GI bleeding    "ulcer"- required 4 units Blood- 4'14 [facility]   H/O: alcohol abuse    x 5 yrs none- then has had a few drinks in past few weeks to help with stress"   Headache    migraines"early years"-no longer a problem   Neuromuscular disorder (HCC)    Restless leg syndrome    Gabapentin helps   Transfusion history     PAST SURGICAL HISTORY: Past Surgical History:  Procedure Laterality Date   BREAST SURGERY     breast biopsy- benign   Childbirth     x1 NVD   KNEE SURGERY Left 03/20/2022   TONSILLECTOMY     child   TOTAL HIP ARTHROPLASTY Right 11/25/2015   Procedure: RIGHT TOTAL HIP ARTHROPLASTY ANTERIOR APPROACH;  Surgeon: Kathryne Hitch, MD;  Location: [facility];  Service: Orthopedics;  Laterality: Right;    FAMILY HISTORY: History reviewed. No pertinent family history.  SOCIAL HISTORY: Social History   Socioeconomic History   Marital status: Married    Spouse name: [name]   Number of children: Not on file   Years of education: Not on file   Highest education level: Not on file  Occupational History   Not on file  Tobacco Use   Smoking status: Former    Types: Cigarettes   Smokeless tobacco: Never   Tobacco comments:    smokes 4 cigs per day-"helps with anxiety"  Vaping Use   Vaping  status: Every Day  Substance and Sexual Activity   Alcohol use: No    Comment: Hx. Alcohol abuse- 23yrs ago"has had a few drinks wine since leading up to surgery"   Drug use: No   Sexual activity: Yes    Birth control/protection: None  Other Topics Concern   Not on file  Social History Narrative   Lives with husband   Right Handed   Drinks 3-4 cups caffeine daily   Social Determinants of Health   Financial Resource Strain: Not on file  Food Insecurity: Not on file  Transportation Needs: Not on file  Physical Activity: Not on file  Stress: Not on file  Social Connections: Not on file  Intimate Partner Violence: Not on file   PHYSICAL EXAM  Vitals:   11/07/22 1030  BP: 114/77  Pulse: (!) 101  Weight: 144 lb 8 oz (65.5 kg)  Height: 5\' 4"  (1.626 m)   Body mass index is 24.8 kg/m.  Generalized: Well developed, in no acute distress  Neurological examination  Mentation: Alert oriented to time, place, history taking. Follows all commands speech and language fluent Cranial nerve II-XII: Pupils were equal round reactive to light. Extraocular movements were full, visual field were full on confrontational test. Facial sensation and strength were normal. Head turning and shoulder shrug  were normal and symmetric. Motor: The motor testing reveals 5 over 5 strength of all 4 extremities. Good symmetric motor tone is noted throughout.  Sensory: Sensory testing is intact to soft touch on all 4 extremities. No evidence of extinction is noted.  Coordination: Cerebellar testing reveals good finger-nose-finger and heel-to-shin bilaterally.  Gait and station: Gait is normal.   DIAGNOSTIC DATA (LABS, IMAGING, TESTING) - I reviewed patient records, labs, notes, testing and imaging myself where available.  Lab Results  Component Value Date   WBC 7.5 11/28/2015   HGB 7.8 (L) 11/28/2015   HCT 24.6 (L) 11/28/2015   MCV 87.9 11/28/2015   PLT 293 11/28/2015  Component Value Date/Time    NA 141 11/26/2015 0408   K 4.1 11/26/2015 0408   CL 111 11/26/2015 0408   CO2 26 11/26/2015 0408   GLUCOSE 135 (H) 11/26/2015 0408   BUN 11 11/26/2015 0408   CREATININE 0.62 11/26/2015 0408   CALCIUM 8.7 (L) 11/26/2015 0408   GFRNONAA >60 11/26/2015 0408   GFRAA >60 11/26/2015 0408   No results found for: "CHOL", "HDL", "LDLCALC", "LDLDIRECT", "TRIG", "CHOLHDL" No results found for: "HGBA1C" No results found for: "VITAMINB12" No results found for: "TSH"  Margie Ege, AGNP-C, DNP 11/07/2022, 10:43 AM [facility] [address], [address] [place], [place] [address] [phone]

## 2022-11-07 NOTE — Patient Instructions (Signed)
Great to meet you today!  We will continue current medications.  Please let me know if your headaches increase.  See you in 1 year.  Thanks!!

## 2023-05-27 ENCOUNTER — Other Ambulatory Visit: Payer: Self-pay

## 2023-05-27 MED ORDER — TOPIRAMATE 100 MG PO TABS: 100.0000 mg | ORAL_TABLET | Freq: Every evening | ORAL | 3 refills | Status: DC

## 2023-11-13 ENCOUNTER — Ambulatory Visit (INDEPENDENT_AMBULATORY_CARE_PROVIDER_SITE_OTHER): Payer: Medicare Other | Admitting: Neurology

## 2023-11-13 ENCOUNTER — Encounter: Payer: Self-pay | Admitting: Neurology

## 2023-11-13 VITALS — BP 117/74 | HR 85 | Ht 64.0 in | Wt 149.2 lb

## 2023-11-13 DIAGNOSIS — G43709 Chronic migraine without aura, not intractable, without status migrainosus: Secondary | ICD-10-CM

## 2023-11-13 MED ORDER — TOPIRAMATE 100 MG PO TABS: 100.0000 mg | ORAL_TABLET | Freq: Every evening | ORAL | 3 refills | Status: AC

## 2023-11-13 MED ORDER — SUMATRIPTAN SUCCINATE 50 MG PO TABS: 50.0000 mg | ORAL_TABLET | ORAL | 3 refills | Status: AC | PRN

## 2023-11-13 NOTE — Progress Notes (Signed)
 Patient: Daisy Carson Date of Birth: 12-19-1955  Reason for Visit: Follow up History from: Patient Primary Neurologist: Camara  ASSESSMENT AND PLAN 68 y.o. year old female   1.  Chronic migraine headache  -Migraines doing well, continue Topamax  100 mg at bedtime for migraine prevention, does not wish to lower the dose any  -Continue Imitrex  50 mg as needed for acute headache treatment -Follow-up with me in 1 year or sooner if needed  HISTORY OF PRESENT ILLNESS: Today 11/13/23 No migraines since July. Remains on Topamax  100 mg at bedtime. Had to increase her dose of Abilify, her dog passed away. Going to [employer] regularly. Seeing a therapist. Is tired by the afternoon. Tylenol  and ice pack will relieve the headache, keeps Imitrex  on hand. Satisfied with migraine control. Wants to continue current dose of Topamax .   11-15-22 SS: Her sister passed away after long battle with lung cancer. This didn't trigger a migraine. On average 1 migraine every 6-8 weeks. Remains on Topamax  100 mg at bedtime. Takes Imitrex  as needed, rarely takes it. An ice pack and Tylenol  works well for her. She does feel sleepy at night after dinner. Mentions on gabapentin  600 mg TID for pain in her legs, causing her drowsiness.  Reviewed labs from PCP April 2024, CBC normal, creatinine 0.8, AST 18, ALT 12, chloride 109.  HISTORY  INTERVAL HISTORY 11/08/2021 Patient presents today for follow-up, reports headaches are much better, it went from once a week to about  1/month or less.  She is compliant with the Topamax ,  if she has headache she usually takes over-the-counter medication, she has not been taking the sumatriptan .  No other complaints.  She has noted some word finding difficulty but felt like it was secondary to the gabapentin , she is on gabapentin  600 mg 3 times daily.     INTERVAL HISTORY 05/03/2021 Patient presents today for follow-up, at last visit in September plan was to start Topamax  for her  migraine.  She is currently taking Topamax  50 mg daily but reports about 1 headache per week.  Usually when he gets the headaches, it last the whole day and the pain is more on the left eye.  She reports this is her typical migraine.  She has not tried any abortive medication except Tylenol  PM.  She reported her doctor had given her a sample for Nurtec but she experienced bad diarrhea with the medication does self discontinued   HISTORY OF PRESENT ILLNESS:  This is a 68 year old woman with past medical history of alcohol abuse, osteoporosis, hyperlipidemia, headaches who is presenting after hospitalization for possible TIA and encephalopathy.  History obtained via chart review and from husband.  Her husband mentioned that on July 20th, patient was working in the garden when she came inside the house she looks flushed, she was able to go to her meeting that day and when she got back in the house she was confused with she was not making any sense. She was observed but her shoes in the dishwasher.  She was able to rest and go to sleep.  Husband noted from July 20 to the 27 she had intermittent confusion and on the 27th she was not making any sense, was acting strange and has some weakness therefore he called EMS.  Patient was taken to a local hospital and admitted.  During this admission, she had no clear source of infection, she had a MRI brain which was negative, she also had a CTA of the  head and neck which did not show any occlusion.  They performed a EEG which did not show any seizure activity and no epileptiform discharge.  She also have echocardiogram which was normal she was discharged home and she was felt to have at the TIA versus heat exhaustion versus encephalopathy of unknown etiology.   Husband also has mentioned that in early August patient had food from fast food and believes that she may have had a food poisoning because since then she has been having vomiting, cannot keep anything down until  August 27.  He has been keeping a diary.  On August 28 husband noted that she has been back to his to her normal self, no more vomiting.  Patient also noted she had a history of migraine headache usually will get to 3 headache per week headaches is left on the left as the left side of her face behind the left eye described the pain as sharp pain, sometimes she will see a spot.  When she used to take Tylenol  for headaches.  At time headaches can be bad associated with nausea with photophobia as an seeing spots.  She is currently not on any preventive or abortive medication.  REVIEW OF SYSTEMS: Out of a complete 14 system review of symptoms, the patient complains only of the following symptoms, and all other reviewed systems are negative.  See HPI  ALLERGIES: Allergies  Allergen Reactions   Nsaids     Other reaction(s): Other (See Comments)  Contraindicated due to GI bleed  Other Reaction(s): Other (See Comments)  Other reaction(s): Other (See Comments)  Contraindicated due to GI bleed  Contraindicated due to GI bleed   Nurtec [Rimegepant Sulfate] Diarrhea    HOME MEDICATIONS: Outpatient Medications Prior to Visit  Medication Sig Dispense Refill   acetaminophen  (TYLENOL ) 650 MG CR tablet Take 1,300 mg by mouth daily as needed for pain.     alendronate (FOSAMAX) 70 MG tablet Take 1 tablet by mouth once a week.     ARIPiprazole (ABILIFY) 5 MG tablet Take 1 tablet by mouth daily. (Patient taking differently: Take 10 mg by mouth daily.)     atorvastatin  (LIPITOR) 20 MG tablet Take 20 mg by mouth daily.     Boswellia-Glucosamine-Vit D (OSTEO BI-FLEX ONE PER DAY PO) Take 1 tablet by mouth daily.     FLUoxetine (PROZAC) 20 MG capsule Take 1 capsule by mouth daily.     gabapentin  (NEURONTIN ) 600 MG tablet Take 600 mg by mouth 3 (three) times daily.     Melatonin 10 MG TABS Take 1 tablet by mouth at bedtime.     Misc Natural Products (OSTEO BI-FLEX TRIPLE STRENGTH PO) Take 2 tablets by mouth  daily.     Multiple Vitamins-Minerals (CENTRUM SILVER PO) Take 1 tablet by mouth daily.     ondansetron  (ZOFRAN -ODT) 4 MG disintegrating tablet Take 1 tablet (4 mg total) by mouth every 8 (eight) hours as needed for nausea or vomiting. 20 tablet 0   pantoprazole  (PROTONIX ) 40 MG tablet Take 40 mg by mouth daily.     SUMAtriptan  (IMITREX ) 50 MG tablet Take 1 tablet (50 mg total) by mouth every 2 (two) hours as needed for migraine. May repeat in 2 hours if headache persists or recurs. 10 tablet 3   topiramate  (TOPAMAX ) 100 MG tablet Take 1 tablet (100 mg total) by mouth at bedtime. 90 tablet 3   No facility-administered medications prior to visit.    PAST MEDICAL HISTORY: Past Medical History:  Diagnosis Date   Arthritis    osteoarthritis right hip/ hands   At low risk for impaired skin integrity    rash lower right leg around ankle and lower leg-Uses topical cream as needed, goes and comes for over a year   Blood donor    Pt statesa regular blood donor -every 3 months   Depression    GERD (gastroesophageal reflux disease)    pantoprazole  controls.   GI bleeding    ulcer- required 4 units Blood- 4'14 [facility]   H/O: alcohol abuse    x 5 yrs none- then has had a few drinks in past few weeks to help with stress   Headache    migrainesearly years-no longer a problem   Neuromuscular disorder (HCC)    Restless leg syndrome    Gabapentin  helps   Transfusion history     PAST SURGICAL HISTORY: Past Surgical History:  Procedure Laterality Date   BREAST SURGERY     breast biopsy- benign   Childbirth     x1 NVD   KNEE SURGERY Left 03/20/2022   TONSILLECTOMY     child   TOTAL HIP ARTHROPLASTY Right 11/25/2015   Procedure: RIGHT TOTAL HIP ARTHROPLASTY ANTERIOR APPROACH;  Surgeon: Lonni CINDERELLA Poli, MD;  Location: [facility];  Service: Orthopedics;  Laterality: Right;    FAMILY HISTORY: Family History  Problem Relation Age of Onset   Migraines Maternal  Great-grandmother     SOCIAL HISTORY: Social History   Socioeconomic History   Marital status: Married    Spouse name: [name]   Number of children: Not on file   Years of education: Not on file   Highest education level: Not on file  Occupational History   Not on file  Tobacco Use   Smoking status: Former    Types: Cigarettes   Smokeless tobacco: Never   Tobacco comments:    smokes 4 cigs per day-helps with anxiety  Vaping Use   Vaping status: Every Day  Substance and Sexual Activity   Alcohol use: No    Comment: Hx. Alcohol abuse- 62yrs agohas had a few drinks wine since leading up to surgery   Drug use: No   Sexual activity: Yes    Birth control/protection: None  Other Topics Concern   Not on file  Social History Narrative   Lives with husband   Right Handed   Drinks 3-4 cups caffeine daily   Retired    [employer] Strain: Not on file  Food Insecurity: Not on file  Transportation Needs: Not on file  Physical Activity: Not on file  Stress: Not on file  Social Connections: Not on file  Intimate Partner Violence: Not on file   PHYSICAL EXAM  Vitals:   11/13/23 1043  BP: 117/74  Pulse: 85  Weight: 149 lb 3.2 oz (67.7 kg)  Height: 5' 4 (1.626 m)    Body mass index is 25.61 kg/m.  Generalized: Well developed, in no acute distress  Neurological examination  Mentation: Alert oriented to time, place, history taking. Follows all commands speech and language fluent Cranial nerve II-XII: Pupils were equal round reactive to light. Extraocular movements were full, visual field were full on confrontational test. Facial sensation and strength were normal. Head turning and shoulder shrug  were normal and symmetric. Motor: The motor testing reveals 5 over 5 strength of all 4 extremities. Good symmetric motor tone is noted throughout.  Sensory: Sensory testing is intact to  soft touch on all 4 extremities. No evidence of extinction  is noted.  Coordination: Cerebellar testing reveals good finger-nose-finger and heel-to-shin bilaterally.  Gait and station: Gait is normal.   DIAGNOSTIC DATA (LABS, IMAGING, TESTING) - I reviewed patient records, labs, notes, testing and imaging myself where available.  Lab Results  Component Value Date   WBC 7.5 11/28/2015   HGB 7.8 (L) 11/28/2015   HCT 24.6 (L) 11/28/2015   MCV 87.9 11/28/2015   PLT 293 11/28/2015      Component Value Date/Time   NA 141 11/26/2015 0408   K 4.1 11/26/2015 0408   CL 111 11/26/2015 0408   CO2 26 11/26/2015 0408   GLUCOSE 135 (H) 11/26/2015 0408   BUN 11 11/26/2015 0408   CREATININE 0.62 11/26/2015 0408   CALCIUM  8.7 (L) 11/26/2015 0408   GFRNONAA >60 11/26/2015 0408   GFRAA >60 11/26/2015 0408   No results found for: CHOL, HDL, LDLCALC, LDLDIRECT, TRIG, CHOLHDL No results found for: YHAJ8R No results found for: VITAMINB12 No results found for: TSH  Lauraine Born, AGNP-C, DNP 11/13/2023, 11:24 AM [facility] [address] [place], [place] [address] [phone]

## 2023-11-13 NOTE — Patient Instructions (Signed)
 Great to see you today! Continue Topamax  for migraine prevention Use Imitrex  as needed Call for increase in headache Follow-up in 1 year.  Thanks!!

## 2024-11-19 ENCOUNTER — Ambulatory Visit: Admitting: Neurology
